# Patient Record
Sex: Male | Born: 1940 | Race: White | Hispanic: No | Marital: Married
Health system: Midwestern US, Community
[De-identification: ages and names within clinical notes are randomized; demographics above are authoritative.]

## PROBLEM LIST (undated history)

## (undated) DIAGNOSIS — Z85038 Personal history of other malignant neoplasm of large intestine: Secondary | ICD-10-CM

## (undated) DIAGNOSIS — K6289 Other specified diseases of anus and rectum: Principal | ICD-10-CM

## (undated) DIAGNOSIS — E119 Type 2 diabetes mellitus without complications: Secondary | ICD-10-CM

## (undated) DIAGNOSIS — I359 Nonrheumatic aortic valve disorder, unspecified: Secondary | ICD-10-CM

## (undated) DIAGNOSIS — F32A Depression, unspecified: Secondary | ICD-10-CM

## (undated) DIAGNOSIS — R569 Unspecified convulsions: Secondary | ICD-10-CM

## (undated) DIAGNOSIS — C801 Malignant (primary) neoplasm, unspecified: Secondary | ICD-10-CM

## (undated) DIAGNOSIS — F419 Anxiety disorder, unspecified: Secondary | ICD-10-CM

## (undated) DIAGNOSIS — K50911 Crohn's disease, unspecified, with rectal bleeding: Secondary | ICD-10-CM

## (undated) DIAGNOSIS — Z01818 Encounter for other preprocedural examination: Secondary | ICD-10-CM

## (undated) HISTORY — DX: Anxiety disorder, unspecified: F41.9

## (undated) HISTORY — PX: AORTIC VALVE REPLACEMENT: SHX41

## (undated) HISTORY — PX: ABLATION: SHX5711

## (undated) HISTORY — DX: Depression, unspecified: F32.A

## (undated) HISTORY — PX: PACEMAKER IMPLANT: EP1218

---

## 2016-06-23 ENCOUNTER — Inpatient Hospital Stay: Primary: Internal Medicine

## 2016-06-27 NOTE — Other (Signed)
Patient verified name, DOB, and procedure.    Type: 1a; abbreviated assessment per anesthesia guidelines  Labs per surgeon: unknown-no orders  Labs per anesthesia: none  Requested records from Sam at [facility].  Spoke with Marchelle [place]manda [place] to [place] defib.  Horton stated no need for defib rep DOS.  Call to [facility] to obtain permission to hold Eliquis.  Spoke with Jasmine DecemberSharon in [facility] to request     Instructed pt that they will be notified via office/GI LAB for time of arrival to GI lab.     Follow diet and prep instructions per office. NPO after midnight.    Bath or shower the night before and the am of surgery with antibacterial soap. No lotions, oils, powders, cologne on skin. No make up, eye make up or jewelry. Wear loose fitting comfortable, clean clothing.     Must have adult present in building the entire time .     Medications for the day of procedure ASA 81 mg (pt only taking in place of Eliquis), vimpat, metoprolol, patient to hold Multivitamin, calcium  - [facility] to hold Eliquis.  Pt reports per Dr Daisey MustMazenac last dose Eliquis 05-23-16.    The following discharge instructions reviewed with patient: medication given during procedure may cause drowsiness for several hours, therefore, do not drive or operate machinery for remainder of the day. You may not drink alcohol on the day of your procedure, please resume regular diet and activity unless otherwise directed. You may experience abdominal distention for several hours that is relieved by the passage of gas. Contact your physician if you have any of the following: fever or chills, severe abdominal pain or excessive amount of bleeding or a large amount when having a bowel movement. Occasional specks of blood with bowel movement would not be unusual.

## 2016-06-27 NOTE — Other (Signed)
Received stress test 04-01-16 "no ischemia noted", EKG 06-21-16 pacer rhythm, defib interrogation report 06-20-16,also on chart [facility] jud[facility] magnet documentation, Rostin NP note 05-29-16, permission to hold Eliquis 48 hours prior to procedure from [facility]ortonMerrell NP 06-26-16.

## 2016-06-28 ENCOUNTER — Inpatient Hospital Stay: Payer: MEDICARE

## 2016-06-28 ENCOUNTER — Ambulatory Visit

## 2016-06-28 HISTORY — PX: COLONOSCOPY WITH ESOPHAGOGASTRODUODENOSCOPY (EGD): SHX5779

## 2016-06-28 LAB — GLUCOSE, POC: Glucose (POC): 112 mg/dL — ABNORMAL HIGH (ref 65–100)

## 2016-06-28 MED ORDER — LACTATED RINGERS IV
INTRAVENOUS | Status: DC
Start: 2016-06-28 — End: 2016-06-28
  Administered 2016-06-28: 12:00:00 via INTRAVENOUS

## 2016-06-28 MED ORDER — PROPOFOL 10 MG/ML IV EMUL
10 mg/mL | INTRAVENOUS | Status: DC | PRN
Start: 2016-06-28 — End: 2016-06-28
  Administered 2016-06-28 (×2): via INTRAVENOUS

## 2016-06-28 MED ORDER — PROPOFOL 10 MG/ML IV EMUL
10 mg/mL | INTRAVENOUS | Status: DC | PRN
Start: 2016-06-28 — End: 2016-06-28
  Administered 2016-06-28 (×3): via INTRAVENOUS

## 2016-06-28 MED FILL — PROPOFOL 10 MG/ML IV EMUL: 10 mg/mL | INTRAVENOUS | Qty: 378.51

## 2016-06-28 NOTE — Anesthesia Post-Procedure Evaluation (Signed)
Post-Anesthesia Evaluation and Assessment    Patient: Lance DeforestLester Bradford MRN: [id number]  SSN: [id number]    Date of Birth: March 12, 1941  Age: 76 y.o.  Sex: male       Cardiovascular Function/Vital Signs  Visit Vitals   ??? BP 108/58   ??? Pulse 69   ??? Temp 36.7 ??C (98.1 ??F)   ??? Resp 14   ??? Ht 6\' 1"  (1.854 m)   ??? Wt 86.2 kg (190 lb)   ??? SpO2 95%   ??? BMI 25.07 kg/m2       Patient is status post total IV anesthesia anesthesia for Procedure(s):  COLONOSCOPY/ 25 / PT HAS ICD  ESOPHAGOGASTRODUODENOSCOPY (EGD)  ESOPHAGOGASTRODUODENAL (EGD) BIOPSY  COLON BIOPSY.    Nausea/Vomiting: None    Postoperative hydration reviewed and adequate.    Pain:  Pain Scale 1: Visual (06/28/16 0806)  Pain Intensity 1: 0 (06/28/16 0806)   Managed    Neurological Status:       At baseline    Mental Status and Level of Consciousness: Arousable    Pulmonary Status:   O2 Device: Nasal cannula (06/28/16 0806)   Adequate oxygenation and airway patent    Complications related to anesthesia: None    Post-anesthesia assessment completed. No concerns    Signed By: Hal NeerPaul E Zelig Gacek, MD     June 28, 2016

## 2016-06-28 NOTE — Progress Notes (Addendum)
Blood sugar was 112 in prep.

## 2016-06-28 NOTE — Procedures (Signed)
ESOPHAGOGASTRODUODENOSCOPY    ESOPHAGOGASTRODUODENOSCOPY    DATE of PROCEDURE: 06/28/2016    PT NAME: Lance Bradford     [id number]  HX Heartburn  MEDICATION:  TIVA    INSTRUMENT: GIF  H190      BLOOD LOSS- 0 or min.  SPEC- Duodenal and gastric biopsies    PROCEDURE:  Standard EGD.  No immediate complications.  Has a small hiatal hernia and mild gastritis.  Biopsies taken in the stomach and duodenum,  Duodenum looks normal.  Ampulla not seen. No retained liquids and no varices.  Esophagus looks normal.    ASSESSMENT:  1. Mild gastritis  2. Small hiatal hernia    PLAN:   1. Continue Prilosec  2. F/U biopsies    Aldred Mase A Corrin Sieling, MD

## 2016-06-28 NOTE — Progress Notes (Signed)
Blood sugar 112

## 2016-06-28 NOTE — Other (Signed)
Patient discharged to car by w/c with staff . Seen by MD. Vitals stable. Able to pass gas. Discharge instructions reviewed with patient and family. Copy sent home with family and patient.

## 2016-06-28 NOTE — H&P (Signed)
Date of Surgery Update:  Lance Bradford was seen and examined.  History and physical has been reviewed. The patient has been examined. There have been no significant clinical changes since the completion of the originally dated History and Physical.    Signed By: Dionne MiloPaul A Divine Imber, MD     June 28, 2016 7:02 AM

## 2016-06-28 NOTE — Anesthesia Pre-Procedure Evaluation (Addendum)
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   Other Findings            Physical Exam    Airway  Mallampati: II  TM Distance: > 6 cm  Neck ROM: decreased range of motion   Mouth opening: Normal     Cardiovascular    Rhythm: irregular  Rate: normal    Murmur: Grade 1, Aortic area     Dental    Dentition: Full upper dentures     Pulmonary  Breath sounds clear to auscultation               Abdominal  GI exam deferred       Other Findings            Anesthetic Plan    ASA: 3  Anesthesia type: total IV anesthesia            Anesthetic plan and risks discussed with: Patient and Spouse

## 2016-06-28 NOTE — Procedures (Signed)
ESOPHAGOGASTRODUODENOSCOPY    ESOPHAGOGASTRODUODENOSCOPY    DATE of PROCEDURE: 06/28/2016    PT NAME: Lance Bradford     [id number]  HX Heartburn  MEDICATION:  TIVA    INSTRUMENT: GIF  H190      BLOOD LOSS- 0 or min.  SPEC- Duodenal and gastric biopsies    PROCEDURE:  Standard EGD.  No immediate complications.  Has a small hiatal hernia and mild gastritis.  Biopsies taken in the stomach and duodenum,  Duodenum looks normal.  Ampulla not seen. No retained liquids and no varices.  Esophagus looks normal.    ASSESSMENT:  1. Mild gastritis  2. Small hiatal hernia    PLAN:   1. Continue Prilosec  2. F/U biopsies    Dionne MiloPaul A Quintara Bost, MD

## 2016-06-28 NOTE — Procedures (Signed)
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  They may resume normal diet and medications unless otherwise instructed in recovery.  Start Eliquis tomorrow    Surveillance interval: 3 years    Anesthesia/Sedation: TIVA  Procedure Details:  Informed consent was obtained for the procedure, including sedation.  Risks of perforation, hemorrhage, adverse drug reaction and aspiration were discussed. The patient was placed in the left lateral decubitus position.  Based on the pre-procedure assessment, including review of the patient's medical history, medications, allergies, and review of systems, he had been deemed to be an appropriate candidate for conscious sedation; he was therefore sedated with the medications listed below.   The patient was monitored continuously with ECG tracing, pulse oximetry, blood pressure monitoring, and direct observations.      A rectal examination was performed.  The colonoscope was inserted into the rectum and advanced under direct vision to the ileo cectomy, anastomosis The quality of the colonic preparation was good.   A careful inspection was made as the colonoscope was withdrawn.    Findings:    ILEUM: The ileum was entered and appeared with a moderate number of ulcers of moderate size.  Mild stenosis was seen.  Biopsies were taken at the anastomosis.  COLON:  The colonic mucosa from the anastomosis appeared normal with normal vascularity.  There was no  inflammatory changes, mucosal polyps, raised lesions, vascular ectasias or abnormal pigmentation.  Random biopsies were taken.  ANUS/RECTUM: Anal exam reveals no masses or hemorrhoids, sphincter tone is normal.  A possible healing fistula was seen. Rectal exam reveals no masses or  hemorrhoids.  On retroflex there was a possible fistula.      EBL: Minimal           PATH:  Anastomosis biopsies and random colon    Complications: None; patient tolerated the procedure well.           Attending Attestation: I performed the procedure.    Plan to follow up biopsies and plan therapy with OV.    Dionne MiloPaul A Emmalia Heyboer, MD

## 2016-06-28 NOTE — Procedures (Signed)
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  They may resume normal diet and medications unless otherwise instructed in recovery.  Start Eliquis tomorrow    Surveillance interval: 3 years    Anesthesia/Sedation: TIVA  Procedure Details:  Informed consent was obtained for the procedure, including sedation.  Risks of perforation, hemorrhage, adverse drug reaction and aspiration were discussed. The patient was placed in the left lateral decubitus position.  Based on the pre-procedure assessment, including review of the patient's medical history, medications, allergies, and review of systems, he had been deemed to be an appropriate candidate for conscious sedation; he was therefore sedated with the medications listed below.   The patient was monitored continuously with ECG tracing, pulse oximetry, blood pressure monitoring, and direct observations.      A rectal examination was performed.  The colonoscope was inserted into the rectum and advanced under direct vision to the ileo cectomy, anastomosis The quality of the colonic preparation was good.   A careful inspection was made as the colonoscope was withdrawn.    Findings:    ILEUM: The ileum was entered and appeared with a moderate number of ulcers of moderate size.  Mild stenosis was seen.  Biopsies were taken at the anastomosis.  COLON:  The colonic mucosa from the anastomosis appeared normal with normal vascularity.  There was no  inflammatory changes, mucosal polyps, raised lesions, vascular ectasias or abnormal pigmentation.  Random biopsies were taken.  ANUS/RECTUM: Anal exam reveals no masses or hemorrhoids, sphincter tone is normal.  A possible healing fistula was seen. Rectal exam reveals no  masses or hemorrhoids.  On retroflex there was a possible fistula.      EBL: Minimal           PATH:  Anastomosis biopsies and random colon    Complications: None; patient tolerated the procedure well.           Attending Attestation: I performed the procedure.    Plan to follow up biopsies and plan therapy with OV.    Dionne MiloPaul A Dahl Higinbotham, MD

## 2017-12-03 ENCOUNTER — Inpatient Hospital Stay: Admit: 2017-12-03 | Payer: MEDICARE | Primary: Internal Medicine

## 2017-12-03 ENCOUNTER — Inpatient Hospital Stay
Admit: 2017-12-03 | Discharge: 2017-12-05 | Disposition: A | Discharge status: Home / Self Care | Payer: MEDICARE | Attending: Family Medicine | Admitting: Family Medicine

## 2017-12-03 DIAGNOSIS — K50011 Crohn's disease of small intestine with rectal bleeding: Secondary | ICD-10-CM

## 2017-12-03 LAB — METABOLIC PANEL, COMPREHENSIVE
A-G Ratio: 1.1 — ABNORMAL LOW (ref 1.2–3.5)
ALT (SGPT): 29 U/L (ref 12–65)
AST (SGOT): 20 U/L (ref 15–37)
Albumin: 3.4 g/dL (ref 3.2–4.6)
Alk. phosphatase: 66 U/L (ref 50–136)
Anion gap: 7 mmol/L (ref 7–16)
BUN: 17 MG/DL (ref 8–23)
Bilirubin, total: 0.4 MG/DL (ref 0.2–1.1)
CO2: 30 mmol/L (ref 21–32)
Calcium: 8.7 MG/DL (ref 8.3–10.4)
Chloride: 104 mmol/L (ref 98–107)
Creatinine: 1.46 MG/DL (ref 0.8–1.5)
GFR est AA: >60 mL/min/{1.73_m2} (ref >60)
GFR est non-AA: 50 mL/min/{1.73_m2} — ABNORMAL LOW (ref >60)
Globulin: 3.2 g/dL (ref 2.3–3.5)
Glucose: 111 mg/dL — ABNORMAL HIGH (ref 65–100)
Potassium: 3.9 mmol/L (ref 3.5–5.1)
Protein, total: 6.6 g/dL (ref 6.3–8.2)
Sodium: 141 mmol/L (ref 136–145)

## 2017-12-03 LAB — CBC WITH AUTOMATED DIFF
ABS. BASOPHILS: 0.1 10*3/uL (ref 0.0–0.2)
ABS. EOSINOPHILS: 0.2 10*3/uL (ref 0.0–0.8)
ABS. IMM. GRANS.: 0 10*3/uL (ref 0.0–0.5)
ABS. LYMPHOCYTES: 1.2 10*3/uL (ref 0.5–4.6)
ABS. MONOCYTES: 0.4 10*3/uL (ref 0.1–1.3)
ABS. NEUTROPHILS: 4.3 10*3/uL (ref 1.7–8.2)
ABSOLUTE NRBC: 0 10*3/uL (ref 0.0–0.2)
BASOPHILS: 1 % (ref 0.0–2.0)
EOSINOPHILS: 3 % (ref 0.5–7.8)
HCT: 31.3 % — ABNORMAL LOW (ref 41.1–50.3)
HGB: 9.4 g/dL — ABNORMAL LOW (ref 13.6–17.2)
IMMATURE GRANULOCYTES: 0 % (ref 0.0–5.0)
LYMPHOCYTES: 20 % (ref 13–44)
MCH: 23.5 PG — ABNORMAL LOW (ref 26.1–32.9)
MCHC: 30 g/dL — ABNORMAL LOW (ref 31.4–35.0)
MCV: 78.3 FL — ABNORMAL LOW (ref 79.6–97.8)
MONOCYTES: 6 % (ref 4.0–12.0)
MPV: 10.3 FL (ref 9.4–12.3)
NEUTROPHILS: 71 % (ref 43–78)
PLATELET: 187 10*3/uL (ref 150–450)
RBC: 4 M/uL — ABNORMAL LOW (ref 4.23–5.6)
RDW: 16.5 % — ABNORMAL HIGH (ref 11.9–14.6)
WBC: 6.1 10*3/uL (ref 4.3–11.1)

## 2017-12-03 LAB — EKG, 12 LEAD, INITIAL
Atrial Rate: 170 {beats}/min
Calculated R Axis: -79 degrees
Calculated T Axis: 91 degrees
Q-T Interval: 504 ms
QRS Duration: 220 ms
QTC Calculation (Bezet): 540 ms
Ventricular Rate: 69 {beats}/min

## 2017-12-03 LAB — FERRITIN
Ferritin: 12 NG/ML (ref 8–388)
Ferritin: 12 NG/ML (ref 8–388)

## 2017-12-03 LAB — TRANSFERRIN SATURATION
Iron: 15 ug/dL — ABNORMAL LOW (ref 35–150)
Iron: 15 ug/dL — ABNORMAL LOW (ref 35–150)
TIBC: 468 ug/dL — ABNORMAL HIGH (ref 250–450)
TIBC: 468 ug/dL — ABNORMAL HIGH (ref 250–450)
TRANSFERRIN SATURATION: 3 % — ABNORMAL LOW (ref >20)
Transferrin Saturation: 3 % — ABNORMAL LOW (ref >20)

## 2017-12-03 LAB — IRON
Iron: 18 ug/dL — ABNORMAL LOW (ref 35–150)
Iron: 18 ug/dL — ABNORMAL LOW (ref 35–150)

## 2017-12-03 LAB — HGB & HCT
HCT: 31.5 % — ABNORMAL LOW (ref 41.1–50.3)
HGB: 9.5 g/dL — ABNORMAL LOW (ref 13.6–17.2)

## 2017-12-03 LAB — EKG 12-LEAD
Atrial Rate: 170 {beats}/min
Q-T Interval: 504 ms
QRS Duration: 220 ms
QTc Calculation (Bazett): 540 ms
R Axis: -79 degrees
T Axis: 91 degrees
Ventricular Rate: 69 {beats}/min

## 2017-12-03 LAB — COMPREHENSIVE METABOLIC PANEL
ALT: 29 U/L (ref 12–65)
AST: 20 U/L (ref 15–37)
Albumin/Globulin Ratio: 1.1 — ABNORMAL LOW (ref 1.2–3.5)
Albumin: 3.4 g/dL (ref 3.2–4.6)
Alkaline Phosphatase: 66 U/L (ref 50–136)
Anion Gap: 7 mmol/L (ref 7–16)
BUN: 17 MG/DL (ref 8–23)
CO2: 30 mmol/L (ref 21–32)
Calcium: 8.7 MG/DL (ref 8.3–10.4)
Chloride: 104 mmol/L (ref 98–107)
Creatinine: 1.46 MG/DL (ref 0.8–1.5)
EGFR IF NonAfrican American: 50 mL/min/{1.73_m2} — ABNORMAL LOW (ref >60)
GFR African American: >60 mL/min/{1.73_m2} (ref >60)
Globulin: 3.2 g/dL (ref 2.3–3.5)
Glucose: 111 mg/dL — ABNORMAL HIGH (ref 65–100)
Potassium: 3.9 mmol/L (ref 3.5–5.1)
Sodium: 141 mmol/L (ref 136–145)
Total Bilirubin: 0.4 MG/DL (ref 0.2–1.1)
Total Protein: 6.6 g/dL (ref 6.3–8.2)

## 2017-12-03 LAB — HEMOGLOBIN AND HEMATOCRIT
Hematocrit: 31.5 % — ABNORMAL LOW (ref 41.1–50.3)
Hemoglobin: 9.5 g/dL — ABNORMAL LOW (ref 13.6–17.2)

## 2017-12-03 LAB — CBC WITH AUTO DIFFERENTIAL
Basophils %: 1 % (ref 0.0–2.0)
Basophils Absolute: 0.1 10*3/uL (ref 0.0–0.2)
Eosinophils %: 3 % (ref 0.5–7.8)
Eosinophils Absolute: 0.2 10*3/uL (ref 0.0–0.8)
Granulocyte Absolute Count: 0 10*3/uL (ref 0.0–0.5)
Hematocrit: 31.3 % — ABNORMAL LOW (ref 41.1–50.3)
Hemoglobin: 9.4 g/dL — ABNORMAL LOW (ref 13.6–17.2)
Immature Granulocytes: 0 % (ref 0.0–5.0)
Lymphocytes %: 20 % (ref 13–44)
Lymphocytes Absolute: 1.2 10*3/uL (ref 0.5–4.6)
MCH: 23.5 PG — ABNORMAL LOW (ref 26.1–32.9)
MCHC: 30 g/dL — ABNORMAL LOW (ref 31.4–35.0)
MCV: 78.3 FL — ABNORMAL LOW (ref 79.6–97.8)
MPV: 10.3 FL (ref 9.4–12.3)
Monocytes %: 6 % (ref 4.0–12.0)
Monocytes Absolute: 0.4 10*3/uL (ref 0.1–1.3)
NRBC Absolute: 0 10*3/uL (ref 0.0–0.2)
Neutrophils %: 71 % (ref 43–78)
Neutrophils Absolute: 4.3 10*3/uL (ref 1.7–8.2)
Platelets: 187 10*3/uL (ref 150–450)
RBC: 4 M/uL — ABNORMAL LOW (ref 4.23–5.6)
RDW: 16.5 % — ABNORMAL HIGH (ref 11.9–14.6)
WBC: 6.1 10*3/uL (ref 4.3–11.1)

## 2017-12-03 MED ORDER — AMIODARONE 200 MG TAB
200 mg | Freq: Every day | ORAL | Status: DC
Start: 2017-12-03 — End: 2017-12-05
  Administered 2017-12-04 – 2017-12-05 (×2): via ORAL

## 2017-12-03 MED ORDER — PANTOPRAZOLE 40 MG IV SOLR
40 mg | Freq: Two times a day (BID) | INTRAVENOUS | Status: DC
Start: 2017-12-03 — End: 2017-12-04
  Administered 2017-12-04 (×2): via INTRAVENOUS

## 2017-12-03 MED ORDER — POTASSIUM CHLORIDE 20 MEQ ORAL PACKET FOR SOLUTION
20 mEq | Freq: Every day | ORAL | Status: DC
Start: 2017-12-03 — End: 2017-12-05
  Administered 2017-12-04 – 2017-12-05 (×2): via ORAL

## 2017-12-03 MED ORDER — ASCORBIC ACID 500 MG TAB
500 mg | Freq: Every day | ORAL | Status: DC
Start: 2017-12-03 — End: 2017-12-05
  Administered 2017-12-04 – 2017-12-05 (×2): via ORAL

## 2017-12-03 MED ORDER — LACOSAMIDE 50 MG TAB
50 mg | Freq: Two times a day (BID) | ORAL | Status: DC
Start: 2017-12-03 — End: 2017-12-05
  Administered 2017-12-04 – 2017-12-05 (×4): via ORAL

## 2017-12-03 MED ORDER — LACOSAMIDE 100 MG TAB
100 mg | Freq: Two times a day (BID) | ORAL | Status: DC
Start: 2017-12-03 — End: 2017-12-03

## 2017-12-03 MED ORDER — ISOSORBIDE MONONITRATE SR 60 MG 24 HR TAB
60 mg | Freq: Two times a day (BID) | ORAL | Status: DC
Start: 2017-12-03 — End: 2017-12-03

## 2017-12-03 MED ORDER — PRAVASTATIN 80 MG TAB
80 mg | Freq: Every evening | ORAL | Status: DC
Start: 2017-12-03 — End: 2017-12-05
  Administered 2017-12-04 – 2017-12-05 (×2): via ORAL

## 2017-12-03 MED ORDER — ISOSORBIDE MONONITRATE SR 60 MG 24 HR TAB
60 mg | Freq: Every day | ORAL | Status: DC
Start: 2017-12-03 — End: 2017-12-05
  Administered 2017-12-04 – 2017-12-05 (×2): via ORAL

## 2017-12-03 MED ORDER — MULTIVITAMIN ORAL LIQUID
Freq: Every day | ORAL | Status: DC
Start: 2017-12-03 — End: 2017-12-05
  Administered 2017-12-04 – 2017-12-05 (×2): via ORAL

## 2017-12-03 MED ORDER — PANTOPRAZOLE 40 MG IV SOLR
40 mg | Freq: Two times a day (BID) | INTRAVENOUS | Status: DC
Start: 2017-12-03 — End: 2017-12-03

## 2017-12-03 MED ORDER — SODIUM CHLORIDE 0.9 % IV
INTRAVENOUS | Status: DC | PRN
Start: 2017-12-03 — End: 2017-12-05

## 2017-12-03 MED FILL — VIMPAT 100 MG TABLET: 100 mg | ORAL | Qty: 1

## 2017-12-03 MED FILL — SODIUM CHLORIDE 0.9 % IV: INTRAVENOUS | Qty: 250

## 2017-12-03 MED FILL — VIMPAT 50 MG TABLET: 50 mg | ORAL | Qty: 1

## 2017-12-03 MED FILL — MULTIVITAMIN ORAL LIQUID: ORAL | Qty: 5

## 2017-12-03 NOTE — ED Notes (Signed)
TRANSFER - OUT REPORT:    Verbal report given to Kim, RN(name) on Lance Bradford  being transferred to 615(unit) for routine progression of care       Report consisted of patient???s Situation, Background, Assessment and   Recommendations(SBAR).     Information from the following report(s) SBAR, Kardex, ED Summary, MAR and Recent Results was reviewed with the receiving nurse.    Lines:   Peripheral IV 12/03/17 Left Antecubital (Active)        Opportunity for questions and clarification was provided.      Patient transported with:   Tech

## 2017-12-03 NOTE — H&P (View-Only) (Signed)
[facility] Consult Note       Primary GI Physician: Dr. P. Mazanec    Referring Provider: Dr. K. Mewborn, ED    Consult Date:  12/03/2017    Admit Date:  12/03/2017    Chief Complaint:  Melena    Subjective:     History of Present Illness:  Patient is a 76 y.o. male with PMH of (but not limited to) Crohn's ileitis with resection and currently not on medical treatment, A fib with multiple ablations on Eliquis and followed at [facility] with return appt with them next month, DM, HTN, Atrial valve repair followed by Dr. T. Chandler, PVC, Melanoma, Cardiac defribillator, Seizures, OSA, who is seen in consultation at the request of Dr.K. Mewborn for melena and anemia. Lance Bradford has noticed increasing fatigue and exercise intolerance. He has had increasing dyspnea and edema. He developed "black tarry stools" 4 days ago with up to 3 stools in a day prompting a visit to the ED. He had a hemocult card at home and tested his stool which was positive. He has had some left lower abdominal tenderness, but denies any epigastric abdominal pain, N&V, GERD, Dysphagia or new weight loss (he lost weight after atrial valve repair, but had maintained it). He takes Eliquis daily and Aleve 2 pills every 2 weeks. He denies any tobacco or ETOH. Hgb on admission 9.4, Hct 31.3, MCV 78.3, RDW 16.5, plt 187. Previous Hgb in our chart reveal Hgb 12-13 range. BUN 17, creat 1.46. He is being admitted by the hospital service.     Lance Bradford has known Crohn's disease with previous resection of his small bowel. He was last evaluated in our office in 12/2016 by Dr. Mazanec and recommended Entyvio. 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 He has been on Remicade in the past approximately 20 years ago and has been on no medical therapy in years. He has 4-5 BM daily, no nocturnal and no hematochezia.      EGD on 06/28/16 by Dr. Mazanec revealed mild gastritis and small hiatal hernia  Colonoscopy on 06/28/17 by Dr. P. Mazanec revealed moderate number of ulcers at the ileum and mild stenosis. 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 Pathology revealed chronic and acute changes. Repeat in 3 years.     PMH:  Past Medical History:   Diagnosis Date   ??? Arthritis     hips and back    ??? Atrial fibrillation (HCC)     several ablations    ??? Cancer (HCC)     melenoma    ??? 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 Colon polyps     cancerous - removed    ??? Crohn disease (HCC)    ??? 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 last A1C ~ 7.1   ??? Hypertension     simce AVR BP is stable    ??? Liver disease     jaundice 1952   ??? Nausea & vomiting     two occasions    ??? PVC (premature ventricular contraction)     one ablation done    ??? S/P AVR    ??? Seizures (HCC)     most recent 04-24-16   ??? Sleep apnea     does not have   to use C PAP after 20 pound weight loss        PSH:  Past Surgical History:   Procedure Laterality Date   ??? COLONOSCOPY N/A 06/28/2016    COLONOSCOPY/ 25 / PT HAS ICD performed by Paul A Mazanec, MD at [facility]   ??? HX AORTIC VALVE REPLACEMENT     ??? HX APPENDECTOMY     ??? HX COLECTOMY      times 4 or 5 - different times    ??? HX COLONOSCOPY     ??? HX HEART CATHETERIZATION      times 2   ??? HX HEMORRHOIDECTOMY     ??? HX HERNIA REPAIR Bilateral    ??? HX SHOULDER ARTHROSCOPY Right    ??? HX VASECTOMY         Allergies:  Allergies   Allergen Reactions   ??? Latex Other (comments)     Blisters     ??? Codeine Other (comments)     Chest pain    ??? Morphine Other (comments)     Morphine doesn't work    ??? Prozac [Fluoxetine] Other (comments)     "turn red in the sun"    ??? Tape [Adhesive] Other (comments)     Blisters        Home Medications:  Prior to Admission medications    Medication Sig Start Date End Date Taking? Authorizing Provider    metoprolol tartrate (LOPRESSOR) 25 mg tablet Take 25 mg by mouth two (2) times a day. Take day of surgery per anesthesia protocol.    Provider, Historical   potassium chloride SR (K-TAB) 10 mEq tablet Take 20 mEq by mouth two (2) times a day.    Provider, Historical   apixaban (ELIQUIS) 5 mg tablet Take 5 mg by mouth two (2) times a day. Takes due to AVR   Last dose 05-23-16 -per pt , per Dr Mazenac    Provider, Historical   multivitamin (ONE A DAY) tablet Take 1 Tab by mouth daily. Stop seven days prior to surgery per anesthesia protocol.    Provider, Historical   lacosamide (VIMPAT) 100 mg tab tablet Take 100 mg by mouth two (2) times a day. Take day of surgery per anesthesia protocol.    Provider, Historical   ferrous sulfate (IRON) 325 mg (65 mg iron) tablet Take 65 mg by mouth Daily (before breakfast).    Provider, Historical   aspirin delayed-release 81 mg tablet Take 81 mg by mouth daily. Take day of surgery per anesthesia protocol.    Provider, Historical   metroNIDAZOLE (FLAGYL) 500 mg tablet Take 500 mg by mouth three (3) times daily.    Provider, Historical   ascorbic acid, vitamin C, (VITAMIN C) 500 mg tablet Take 500 mg by mouth daily. Stop seven days prior to surgery per anesthesia protocol.    Provider, Historical       Hospital Medications:  Current Facility-Administered Medications   Medication Dose Route Frequency   ??? 0.9% sodium chloride infusion 250 mL  250 mL IntraVENous PRN     Current Outpatient Medications   Medication Sig   ??? metoprolol tartrate (LOPRESSOR) 25 mg tablet Take 25 mg by mouth two (2) times a day. Take day of surgery per anesthesia protocol.   ??? potassium chloride SR (K-TAB) 10 mEq tablet Take 20 mEq by mouth two (2) times a day.   ??? apixaban (ELIQUIS) 5 mg tablet Take 5 mg by mouth two (2) times a day. Takes   due to AVR   Last dose 05-23-16 -per pt , per Dr Mazenac   ??? multivitamin (ONE A DAY) tablet Take 1 Tab by mouth daily. Stop seven  days prior to surgery per anesthesia protocol.   ??? lacosamide (VIMPAT) 100 mg tab tablet Take 100 mg by mouth two (2) times a day. Take day of surgery per anesthesia protocol.   ??? ferrous sulfate (IRON) 325 mg (65 mg iron) tablet Take 65 mg by mouth Daily (before breakfast).   ??? aspirin delayed-release 81 mg tablet Take 81 mg by mouth daily. Take day of surgery per anesthesia protocol.   ??? metroNIDAZOLE (FLAGYL) 500 mg tablet Take 500 mg by mouth three (3) times daily.   ??? ascorbic acid, vitamin C, (VITAMIN C) 500 mg tablet Take 500 mg by mouth daily. Stop seven days prior to surgery per anesthesia protocol.       Social History:  Social History     Tobacco Use   ??? Smoking status: Former Smoker     Packs/day: 1.00     Years: 13.00     Pack years: 13.00     Last attempt to quit: 07/10/1976     Years since quitting: 41.4   ??? Smokeless tobacco: Never Used   Substance Use Topics   ??? Alcohol use: Yes     Comment: seldom - once a year          Family History:  Family History   Problem Relation Age of Onset   ??? Cancer Mother    ??? Hypertension Mother    ??? Cancer Father    ??? Diabetes Father    ??? Hypertension Father        Review of Systems:  A detailed 10 system ROS is obtained, with pertinent positives as listed above.  All others are negative.    Diet:  NPO    Objective:     Physical Exam:  Vitals:  Visit Vitals  BP 122/56 (BP 1 Location: Right arm, BP Patient Position: At rest)   Pulse 72   Temp 98.2 ??F (36.8 ??C)   Resp 18   Ht 6' 1" (1.854 m)   Wt 79.4 kg (175 lb)   SpO2 100%   BMI 23.09 kg/m??     Gen:  Pt is alert, cooperative, no acute distress   Skin:  Extremities and face reveal no rashes.   HEENT: Sclerae anicteric.  Extra-occular muscles are intact.  No oral ulcers.  No abnormal pigmentation of the lips.  The neck is supple.  Cardiovascular: Regular rate and rhythm. No  gallops, or rubs. MURMUR III/VI  Respiratory:  Comfortable breathing with no accessory muscle use. Clear  breath sounds anteriorly with no wheezes, rales, or rhonchi.  GI:  Abdomen nondistended, soft, and  MILD TENDERNESS IN THE LLQ  Normal active bowel sounds. No enlargement of the liver or spleen. No masses palpable.  Rectal:  Deferred  Musculoskeletal:  1+ edema of the lower legs.    Neurological:  Gross memory appears intact.  Patient is alert and oriented.  Psychiatric:  Mood appears appropriate with judgement intact.  Lymphatic:  No cervical or supraclavicular adenopathy.    Laboratory:    Recent Labs     12/03/17  1408   WBC 6.1   HGB 9.4*   HCT 31.3*   PLT 187   MCV 78.3*   NA 141   K 3.9   CL 104   CO2 30   BUN 17     CREA 1.46   CA 8.7   GLU 111*   AP 66   SGOT 20   ALT 29   TBILI 0.4   ALB 3.4   TP 6.6          Assessment:     Active Problems:    GI bleed (12/03/2017)    76 y.o. male with PMH of (but not limited to) Crohn's ileitis with resection and currently not on medical treatment, A fib with multiple ablations on Eliquis and followed at [facility] with return appt with them next month, DM, HTN, Atrial valve repair followed by Dr. T. Chandler, PVC, Melanoma, Cardiac defribillator, Seizures, OSA, who is seen in consultation at the request of Dr.K. Mewborn for melena and anemia. He has a heme positive stool with "black tarry stools" over the last 4 days. Hgb on admission 9.4  with previous Hgb in our chart reveal Hgb 12-13 range.He has been on Eliquis and Aleve and likely has an UGIB. PUD, NSAID induced gastritis, Dieulafoy lesion and AVM are potential differentials.     Lance Bradford has known Crohn's disease with previous resection of his small bowel. He was last evaluated in our office in 12/2016 by Dr. Mazanec and recommended Entyvio.He has failed Remicade previously and has been off all medical therapy for years. He had active disease on recent colonoscopy 06/2016.     Plan:     1.Agree with admission by hospital service  2.Type and Cross  3.Serial H&H and transfuse as needed  4.Iron/TIBC/ferritin   5.Pantoprazole 40mg IV bid  6.Clear liquid diet  7.NPO after midnight  8.EGD tomorrow 12/03/17 by Dr. I. Mahmood Boehringer. Risks and benefits discussed to include risk of infection, bleeding, perforation, anesthesia, and possible mortality. He verbalized understanding and wishes to proceed with EGD.  9.Hold Eliquis for procedure tomorrow    Tracy M. Luther, FNP   [facility]  Patient is seen and examined in collaboration with Dr. I. Benyamin Jeff. Assessment and plan as per Dr. I. Maybell Misenheimer.     I have seen and examined the patient, and I have directed and agreed to the plan as described.  He has evidence of slow GI bleeding, perhaps with more recently.  This is complicated by therapeutic anticoagulation.  I strongly suspect his untreated Crohn's disease.    D. Isaac Cruzito Standre, MD

## 2017-12-03 NOTE — ED Triage Notes (Signed)
Pt states he has had a lot of rectal bleeding for the last 5 days.  Describes stools as black.  Has hx of hemorrhoids.  Has had aortic valve replacement and is on eliquis.  No pepto bismol.  Denies nausea/vomiting but is having dizziness.

## 2017-12-03 NOTE — Consults (Addendum)
[facility] Consult Note       Primary GI Physician: Dr. Piedad Climes    Referring Provider: Dr. Kirtland Bouchard. Mewborn, ED    Consult Date:  12/03/2017    Admit Date:  12/03/2017    Chief Complaint:  Melena    Subjective:     History of Present Illness:  Patient is a 77 y.o. male with PMH of (but not limited to) Crohn's ileitis with resection and currently not on medical treatment, A fib with multiple ablations on Eliquis and followed at [facility] with return appt with them next month, DM, HTN, Atrial valve repair followed by Dr. Jiles Prows, PVC, Melanoma, Cardiac defribillator, Seizures, OSA, who is seen in consultation at the request of Dr.K. Mewborn for melena and anemia. Mr. Obey has noticed increasing fatigue and exercise intolerance. He has had increasing dyspnea and edema. He developed "black tarry stools" 4 days ago with up to 3 stools in a day prompting a visit to the ED. He had a hemocult card at home and tested his stool which was positive. He has had some left lower abdominal tenderness, but denies any epigastric abdominal pain, N&V, GERD, Dysphagia or new weight loss (he lost weight after atrial valve repair, but had maintained it). He takes Eliquis daily and Aleve 2 pills every 2 weeks. He denies any tobacco or ETOH. Hgb on admission 9.4, Hct 31.3, MCV 78.3, RDW 16.5, plt 187. Previous Hgb in our chart reveal Hgb 12-13 range. BUN 17, creat 1.46. He is being admitted by the hospital service.     Mr. Pangborn has known Crohn's disease with previous resection of his small bowel. He was last evaluated in our office in 12/2016 by Dr. Marisa Sprinkles and recommended Entyvio. 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 He has been on Remicade in the past approximately 20 years ago and has been on no medical therapy in years. He has 4-5 BM daily, no nocturnal and no hematochezia.      EGD on 06/28/16 by Dr. Marisa Sprinkles revealed mild gastritis and small hiatal hernia  Colonoscopy on 06/28/17 by Dr. Piedad Climes revealed moderate number of ulcers at the ileum and mild stenosis. 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 Pathology revealed chronic and acute changes. Repeat in 3 years.     PMH:  Past Medical History:   Diagnosis Date   ??? Arthritis     hips and back    ??? Atrial fibrillation (HCC)     several ablations    ??? Cancer (HCC)     melenoma    ??? 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 Colon polyps     cancerous - removed    ??? Crohn disease (HCC)    ??? 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 last A1C ~ 7.1   ??? Hypertension     simce AVR BP is stable    ??? Liver disease     jaundice 1952   ??? Nausea & vomiting     two occasions    ??? PVC (premature ventricular contraction)     one ablation done    ??? S/P AVR    ??? Seizures (HCC)     most recent 04-24-16   ??? Sleep apnea     does not have  to use C PAP after 20 pound weight loss        PSH:  Past Surgical History:   Procedure Laterality Date   ??? COLONOSCOPY N/A 06/28/2016    COLONOSCOPY/ 25 / PT HAS ICD performed by Dionne Milo, MD at [facility] ENDOSCOPY   ??? HX AORTIC VALVE REPLACEMENT     ??? HX APPENDECTOMY     ??? HX COLECTOMY      times 4 or 5 - different times    ??? HX COLONOSCOPY     ??? HX HEART CATHETERIZATION      times 2   ??? HX HEMORRHOIDECTOMY     ??? HX HERNIA REPAIR Bilateral    ??? HX SHOULDER ARTHROSCOPY Right    ??? HX VASECTOMY         Allergies:  Allergies   Allergen Reactions   ??? Latex Other (comments)     Blisters     ??? Codeine Other (comments)     Chest pain    ??? Morphine Other (comments)     Morphine doesn't work    ??? Prozac [Fluoxetine] Other (comments)     "turn red in the sun"    ??? Tape [Adhesive] Other (comments)     Blisters        Home Medications:  Prior to Admission medications    Medication Sig Start Date End Date Taking? Authorizing Provider    metoprolol tartrate (LOPRESSOR) 25 mg tablet Take 25 mg by mouth two (2) times a day. Take day of surgery per anesthesia protocol.    Provider, Historical   potassium chloride SR (K-TAB) 10 mEq tablet Take 20 mEq by mouth two (2) times a day.    Provider, Historical   apixaban (ELIQUIS) 5 mg tablet Take 5 mg by mouth two (2) times a day. Takes due to AVR   Last dose 05-23-16 -per pt , per Dr Daisey Must    Provider, Historical   multivitamin (ONE A DAY) tablet Take 1 Tab by mouth daily. Stop seven days prior to surgery per anesthesia protocol.    Provider, Historical   lacosamide (VIMPAT) 100 mg tab tablet Take 100 mg by mouth two (2) times a day. Take day of surgery per anesthesia protocol.    Provider, Historical   ferrous sulfate (IRON) 325 mg (65 mg iron) tablet Take 65 mg by mouth Daily (before breakfast).    Provider, Historical   aspirin delayed-release 81 mg tablet Take 81 mg by mouth daily. Take day of surgery per anesthesia protocol.    Provider, Historical   metroNIDAZOLE (FLAGYL) 500 mg tablet Take 500 mg by mouth three (3) times daily.    Provider, Historical   ascorbic acid, vitamin C, (VITAMIN C) 500 mg tablet Take 500 mg by mouth daily. Stop seven days prior to surgery per anesthesia protocol.    Provider, Historical       Hospital Medications:  Current Facility-Administered Medications   Medication Dose Route Frequency   ??? 0.9% sodium chloride infusion 250 mL  250 mL IntraVENous PRN     Current Outpatient Medications   Medication Sig   ??? metoprolol tartrate (LOPRESSOR) 25 mg tablet Take 25 mg by mouth two (2) times a day. Take day of surgery per anesthesia protocol.   ??? potassium chloride SR (K-TAB) 10 mEq tablet Take 20 mEq by mouth two (2) times a day.   ??? apixaban (ELIQUIS) 5 mg tablet Take 5 mg by mouth two (2) times a day. Takes  due to AVR   Last dose 05-23-16 -per pt , per Dr Daisey Must   ??? multivitamin (ONE A DAY) tablet Take 1 Tab by mouth daily. Stop seven  days prior to surgery per anesthesia protocol.   ??? lacosamide (VIMPAT) 100 mg tab tablet Take 100 mg by mouth two (2) times a day. Take day of surgery per anesthesia protocol.   ??? ferrous sulfate (IRON) 325 mg (65 mg iron) tablet Take 65 mg by mouth Daily (before breakfast).   ??? aspirin delayed-release 81 mg tablet Take 81 mg by mouth daily. Take day of surgery per anesthesia protocol.   ??? metroNIDAZOLE (FLAGYL) 500 mg tablet Take 500 mg by mouth three (3) times daily.   ??? ascorbic acid, vitamin C, (VITAMIN C) 500 mg tablet Take 500 mg by mouth daily. Stop seven days prior to surgery per anesthesia protocol.       Social History:  Social History     Tobacco Use   ??? Smoking status: Former Smoker     Packs/day: 1.00     Years: 13.00     Pack years: 13.00     Last attempt to quit: 07/10/1976     Years since quitting: 41.4   ??? Smokeless tobacco: Never Used   Substance Use Topics   ??? Alcohol use: Yes     Comment: seldom - once a year          Family History:  Family History   Problem Relation Age of Onset   ??? Cancer Mother    ??? Hypertension Mother    ??? Cancer Father    ??? Diabetes Father    ??? Hypertension Father        Review of Systems:  A detailed 10 system ROS is obtained, with pertinent positives as listed above.  All others are negative.    Diet:  NPO    Objective:     Physical Exam:  Vitals:  Visit Vitals  BP 122/56 (BP 1 Location: Right arm, BP Patient Position: At rest)   Pulse 72   Temp 98.2 ??F (36.8 ??C)   Resp 18   Ht 6\' 1"  (1.854 m)   Wt 79.4 kg (175 lb)   SpO2 100%   BMI 23.09 kg/m??     Gen:  Pt is alert, cooperative, no acute distress   Skin:  Extremities and face reveal no rashes.   HEENT: Sclerae anicteric.  Extra-occular muscles are intact.  No oral ulcers.  No abnormal pigmentation of the lips.  The neck is supple.  Cardiovascular: Regular rate and rhythm. No  gallops, or rubs. MURMUR III/VI  Respiratory:  Comfortable breathing with no accessory muscle use. Clear  breath sounds anteriorly with no wheezes, rales, or rhonchi.  GI:  Abdomen nondistended, soft, and  MILD TENDERNESS IN THE LLQ  Normal active bowel sounds. No enlargement of the liver or spleen. No masses palpable.  Rectal:  Deferred  Musculoskeletal:  1+ edema of the lower legs.    Neurological:  Gross memory appears intact.  Patient is alert and oriented.  Psychiatric:  Mood appears appropriate with judgement intact.  Lymphatic:  No cervical or supraclavicular adenopathy.    Laboratory:    Recent Labs     12/03/17  1408   WBC 6.1   HGB 9.4*   HCT 31.3*   PLT 187   MCV 78.3*   NA 141   K 3.9   CL 104   CO2 30   BUN 17  CREA 1.46   CA 8.7   GLU 111*   AP 66   SGOT 20   ALT 29   TBILI 0.4   ALB 3.4   TP 6.6          Assessment:     Active Problems:    GI bleed (12/03/2017)    77 y.o. male with PMH of (but not limited to) Crohn's ileitis with resection and currently not on medical treatment, A fib with multiple ablations on Eliquis and [facility] with [facility] appt with them next month, DM, HTN, Atrial valve repair followed by Dr. Jiles Prows. [place], PVC, Melanoma, Cardiac defribillator, Seizures, OSA, who is seen in consultation at the request of Dr.K. Mewborn for melena and anemia. He has a heme positive stool with "black tarry stools" over the last 4 days. Hgb on admission 9.4  with previous Hgb in our chart reveal Hgb 12-13 range.He has been on Eliquis and Aleve and likely has an UGIB. PUD, NSAID induced gastritis, Dieulafoy lesion and AVM are potential differentials.     Mr. Lorie PhenixSpangler has known Crohn's disease with previous resection of his small bowel. He was last evaluated in our office in 12/2016 by Dr. Marisa SprinklesMazanec and recommended Entyvio.He has failed Remicade previously and has been off all medical therapy for years. He had active disease on recent colonoscopy 06/2016.     Plan:     1.Agree with admission by hospital service  2.Type and Cross  3.Serial H&H and transfuse as needed  4.Iron/TIBC/ferritin   5.Pantoprazole 40mg  IV bid  6.Clear liquid diet  7.NPO after midnight  8.EGD tomorrow 12/03/17 by Dr. Reynold BowenI. Dedric Ethington. Risks and benefits discussed to include risk of infection, bleeding, perforation, anesthesia, and possible mortality. He verbalized understanding and wishes to proceed with EGD.  9.Hold Eliquis for procedure tomorrow    Reuel Boomracy M. Omelia BlackwaterLuther, FNP   [facility]  Patient is seen and examined in collaboration with Dr. Reynold BowenI. Angelisse Riso. Assessment and plan as per Dr. Reynold BowenI. Magaline Steinberg.     I have seen and examined the patient, and I have directed and agreed to the plan as described.  He has evidence of slow GI bleeding, perhaps with more recently.  This is complicated by therapeutic anticoagulation.  I strongly suspect his untreated Crohn's disease.    Bari Edward. Isaac Rayonna Heldman, MD

## 2017-12-03 NOTE — Progress Notes (Signed)
12/03/17 1750   Dual Skin Pressure Injury Assessment   Dual Skin Pressure Injury Assessment WDL   Second Care Provider (Based on Facility Policy) Miranda, RN    Skin Integumentary   Skin Integumentary (WDL) WDL   Wound Prevention and Protection Methods   Orientation of Wound Prevention Posterior   Location of Wound Prevention Sacrum/Coccyx   Dressing Present  No     Skin assessment completed with Miranda, RN. Skin warm and dry, No open or discolored areas noted. Pt also verbalized no skin abnormalities.

## 2017-12-03 NOTE — H&P (Signed)
H&P by Murvin Natal, NP  at 12/03/17 Cross                Author: Murvin Natal, NP  Service: Internal Medicine  Author Type: Nurse Practitioner       Filed: 12/03/17 1756  Date of Service: 12/03/17 1650  Status: Addendum          Editor: Murvin Natal, NP (Nurse Practitioner)       Related Notes: Original Note by Murvin Natal, NP (Nurse Practitioner) filed at 12/03/17 1713          Cosigner: Otilio Miu, MD at 12/04/17 0709                              Hospitalist H&P Note        Admit Date:  12/03/2017  2:07 PM    Name:  Lance Bradford    Age:  77 y.o.   DOB:  [birth year]    MRN:  [id number]    PCP:  Clarita Leber, MD   Treatment Team: Attending Provider: Verlon Au, MD; Primary Nurse: Terald Sleeper, RN        HPI:        Patient is a 77 yo male with significant PMH of crohn's ileitis with resection, afib on eliquis, aortic valve replacement, multiple ablations, ventricular arrhythmia s/p ICD, HTN, elevated lipids, CAD, AVM's, who came into the ER with reports of melena  stools x 5 days. Patient reports 4 stools yesterday and one today. Also reports of DOE, dizziness, BLE edema. Denies CP, weakness, cough. Does report lower abdominal tenderness. Hgb 9.4. GI consulted in ER. Patient scheduled for EGD in am.                10 systems reviewed and negative except as noted in HPI.     Past Medical History:        Diagnosis  Date         ?  Arthritis            hips and back          ?  Atrial fibrillation ([place])            several ablations          ?  Cancer ([place])            melenoma          ?  Cardiac defibrillator in situ            left side of chest  -never D/C         ?  Colon polyps            cancerous - removed          ?  Crohn disease ([place])       ?  Diabetes ([place])            type 2, adverage glucose 100-120, symtomatic below 70. last A1C ~ 7.1         ?  Hypertension            simce AVR BP is stable          ?  Liver disease            jaundice 1952         ?  Nausea &  vomiting  two occasions          ?  PVC (premature ventricular contraction)            one ablation done          ?  S/P AVR       ?  Seizures ([place])            most recent 04-24-16         ?  Sleep apnea            does not have to use C PAP after 20 pound weight loss            Past Surgical History:         Procedure  Laterality  Date          ?  COLONOSCOPY  N/A  06/28/2016          COLONOSCOPY/ 25 / PT HAS ICD performed by Baruch Merl, MD at [facility] ENDOSCOPY          ?  HX AORTIC VALVE REPLACEMENT         ?  HX APPENDECTOMY         ?  HX COLECTOMY              times 4 or 5 - different times           ?  HX COLONOSCOPY         ?  HX HEART CATHETERIZATION              times 2          ?  HX HEMORRHOIDECTOMY         ?  HX HERNIA REPAIR  Bilateral       ?  HX SHOULDER ARTHROSCOPY  Right            ?  HX VASECTOMY               Allergies        Allergen  Reactions         ?  Latex  Other (comments)             Blisters            ?  Codeine  Other (comments)             Chest pain          ?  Morphine  Other (comments)             Morphine doesn't work          ?  Prozac [Fluoxetine]  Other (comments)             "turn red in the sun"          ?  Tape [Adhesive]  Other (comments)             Blisters            Social History          Tobacco Use         ?  Smoking status:  Former Smoker              Packs/day:  1.00         Years:  13.00         Pack years:  13.00         Last attempt to quit:  07/10/1976  Years since quitting:  41.4         ?  Smokeless tobacco:  Never Used       Substance Use Topics         ?  Alcohol use:  Yes             Comment: seldom - once a year            Family History         Problem  Relation  Age of Onset          ?  Cancer  Mother       ?  Hypertension  Mother       ?  Cancer  Father       ?  Diabetes  Father            ?  Hypertension  Father              There is no immunization history on file for this patient.   PTA Medications:     Prior to Admission Medications      Prescriptions  Last Dose  Informant  Patient Reported?  Taking?      apixaban (ELIQUIS) 5 mg tablet      Yes  No      Sig: Take 5 mg by mouth two (2) times a day. Takes due to AVR    Last dose 05-23-16 -per pt , per Dr Guillermina [place]      ascorbic acid, vitamin C, (VITAMIN C) 500 mg tablet      Yes  No      Sig: Take 500 mg by mouth daily. Stop seven days prior to surgery per anesthesia protocol.      aspirin delayed-release 81 mg tablet      Yes  No      Sig: Take 81 mg by mouth daily. Take day of surgery per anesthesia protocol.      ferrous sulfate (IRON) 325 mg (65 mg iron) tablet      Yes  No      Sig: Take 65 mg by mouth Daily (before breakfast).      lacosamide (VIMPAT) 100 mg tab tablet      Yes  No      Sig: Take 100 mg by mouth two (2) times a day. Take day of surgery per anesthesia protocol.      metoprolol tartrate (LOPRESSOR) 25 mg tablet      Yes  No      Sig: Take 25 mg by mouth two (2) times a day. Take day of surgery per anesthesia protocol.      metroNIDAZOLE (FLAGYL) 500 mg tablet      Yes  No      Sig: Take 500 mg by mouth three (3) times daily.      multivitamin (ONE A DAY) tablet      Yes  No      Sig: Take 1 Tab by mouth daily. Stop seven days prior to surgery per anesthesia protocol.      potassium chloride SR (K-TAB) 10 mEq tablet      Yes  No      Sig: Take 20 mEq by mouth two (2) times a day.               Facility-Administered Medications: None             Objective:        Patient Vitals  for the past 24 hrs:            Temp  Pulse  Resp  BP  SpO2            12/03/17 1647  --  70  17  --  97 %            12/03/17 1627  --  --  --  129/63  99 %            12/03/17 1404  98.2 ??F (36.8 ??C)  72  18  122/56  100 %        Oxygen Therapy   O2 Sat (%): 97 % (12/03/17 1647)   Pulse via Oximetry: 71 beats per minute (12/03/17 1647)   O2 Device: Room air (12/03/17 1404)   No intake or output data in the 24 hours ending 12/03/17 1707     Physical Exam:   General:    Well nourished.  Alert. No distress  noted.    Eyes:   Normal sclera.  Extraocular movements intact.   ENT:  Normocephalic, atraumatic.  Moist mucous membranes   CV:   RRR.  No m/r/g.  Peripheral pulses 2+. Capillary refill <2s.   Lungs:  Rhonci, rales. Room air.    Abdomen: Soft, lower abdominal tenderness, nondistended. Bowel sounds normal.    Extremities: Warm and dry.  No cyanosis or edema.   Neurologic: CN II-XII grossly intact.  Sensation intact.   Skin:     No rashes or jaundice.  Normal coloration   Psych:  Normal mood and affect.      I reviewed the labs, imaging, EKGs, telemetry, and other studies done this admission.   Data Review:      Recent Results (from the past 24 hour(s))     CBC WITH AUTOMATED DIFF          Collection Time: 12/03/17  2:08 PM         Result  Value  Ref Range            WBC  6.1  4.3 - 11.1 K/uL       RBC  4.00 (L)  4.23 - 5.6 M/uL       HGB  9.4 (L)  13.6 - 17.2 g/dL       HCT  31.3 (L)  41.1 - 50.3 %       MCV  78.3 (L)  79.6 - 97.8 FL       MCH  23.5 (L)  26.1 - 32.9 PG       MCHC  30.0 (L)  31.4 - 35.0 g/dL       RDW  16.5 (H)  11.9 - 14.6 %       PLATELET  187  150 - 450 K/uL       MPV  10.3  9.4 - 12.3 FL       ABSOLUTE NRBC  0.00  0.0 - 0.2 K/uL       DF  AUTOMATED          NEUTROPHILS  71  43 - 78 %       LYMPHOCYTES  20  13 - 44 %       MONOCYTES  6  4.0 - 12.0 %       EOSINOPHILS  3  0.5 - 7.8 %       BASOPHILS  1  0.0 - 2.0 %       IMMATURE  GRANULOCYTES  0  0.0 - 5.0 %       ABS. NEUTROPHILS  4.3  1.7 - 8.2 K/UL       ABS. LYMPHOCYTES  1.2  0.5 - 4.6 K/UL       ABS. MONOCYTES  0.4  0.1 - 1.3 K/UL       ABS. EOSINOPHILS  0.2  0.0 - 0.8 K/UL       ABS. BASOPHILS  0.1  0.0 - 0.2 K/UL       ABS. IMM. GRANS.  0.0  0.0 - 0.5 K/UL       METABOLIC PANEL, COMPREHENSIVE          Collection Time: 12/03/17  2:08 PM         Result  Value  Ref Range            Sodium  141  136 - 145 mmol/L       Potassium  3.9  3.5 - 5.1 mmol/L       Chloride  104  98 - 107 mmol/L       CO2  30  21 - 32 mmol/L       Anion gap  7  7 - 16  mmol/L       Glucose  111 (H)  65 - 100 mg/dL       BUN  17  8 - 23 MG/DL            Creatinine  1.46  0.8 - 1.5 MG/DL            GFR est AA  >60  >60 ml/min/1.34m       GFR est non-AA  50 (L)  >60 ml/min/1.724m      Calcium  8.7  8.3 - 10.4 MG/DL       Bilirubin, total  0.4  0.2 - 1.1 MG/DL       ALT (SGPT)  29  12 - 65 U/L       AST (SGOT)  20  15 - 37 U/L       Alk. phosphatase  66  50 - 136 U/L       Protein, total  6.6  6.3 - 8.2 g/dL       Albumin  3.4  3.2 - 4.6 g/dL       Globulin  3.2  2.3 - 3.5 g/dL       A-G Ratio  1.1 (L)  1.2 - 3.5         HGB & HCT          Collection Time: 12/03/17  4:41 PM         Result  Value  Ref Range            HGB  9.5 (L)  13.6 - 17.2 g/dL            HCT  31.5 (L)  41.1 - 50.3 %             All Micro Results           None                  Other Studies:   No results found.        Assessment and Plan:           Hospital Problems  as of 12/03/2017  Date Reviewed:  12/03/2017  Codes  Class  Noted - Resolved  POA              * (Principal) GI bleed  ICD-10-CM: K92.2   ICD-9-CM: 578.9    12/03/2017 - Present  Yes                        A-fib ([place])  ICD-10-CM: I48.91   ICD-9-CM: 427.31    12/03/2017 - Present  Unknown                        Crohn's disease ([place])  ICD-10-CM: K50.90   ICD-9-CM: 555.9    12/03/2017 - Present  Unknown                        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  ICD-10-CM: Z45.02   ICD-9-CM: V53.32    12/03/2017 - Present  Unknown                        Seizure ([place])  ICD-10-CM: R56.9   ICD-9-CM: 780.39    12/03/2017 - Present  Unknown                        HTN (hypertension)  ICD-10-CM: I10   ICD-9-CM: 401.9    12/03/2017 - Present  Unknown                        S/P AVR  ICD-10-CM: Z95.2   ICD-9-CM: V43.3    12/03/2017 - Present  Unknown                          PLAN:   GI Bleed   -transfuse 1 unit PRBC   -GI consult   -CLD, NPO after midnight   -Hgb & Hct   -CBC and BMP in am   -EGD in am   -PPI      Hx  Crohn's disease   -GI consulting      Afib on Eliquis   -hold elquis until cleared by GI   -continue amiodarone      S/P AVR with BLE edema   -66m IV lasix   -hold IVF       CAD   -hold ASA    -continue imdur    -Continue statin      Seizure hx   -continue home medcation       Discharge planning:  home   DVT ppx: SCD's   Code status:  Full   Estimated LOS:  Greater than 2 midnights   Risk:  high      Plan of care discussed with Dr. BRay Church    Signed:   CMurvin Natal NP-C

## 2017-12-03 NOTE — ED Provider Notes (Signed)
The history is provided by the patient.   Rectal Bleeding    This is a new problem. Episode onset: about 4 days ago. Stool description: Black. Associated symptoms include diarrhea (rather chronic from Crohn's disease). Pertinent negatives include no abdominal pain, no dysuria, no chills, no fever, no nausea, no back pain, no vomiting and no constipation. Risk factors: anticoagulated for atrial fibrillation, history of GI bleed in the past due to AV malformation patient ??2. He has tried nothing for the symptoms. Past medical history comments: atrial fibrillation, Crohn's disease, AV malformation.        Past Medical History:   Diagnosis Date   ??? Arthritis     hips and back    ??? Atrial fibrillation (HCC)     several ablations    ??? Cancer ([place])     melenoma    ??? 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 Colon polyps     cancerous - removed    ??? Crohn disease ([place])    ??? Diabetes ([place])     type 2, adverage glucose 100-120, symtomatic below 70. last A1C ~ 7.1   ??? Hypertension     simce AVR BP is stable    ??? Liver disease     jaundice 1952   ??? Nausea & vomiting     two occasions    ??? PVC (premature ventricular contraction)     one ablation done    ??? S/P AVR    ??? Seizures ([place])     most recent 04-24-16   ??? Sleep apnea     does not have to use C PAP after 20 pound weight loss        Past Surgical History:   Procedure Laterality Date   ??? COLONOSCOPY N/A 06/28/2016    COLONOSCOPY/ 25 / PT HAS ICD performed by Baruch Merl, MD at [facility] ENDOSCOPY   ??? HX AORTIC VALVE REPLACEMENT     ??? HX APPENDECTOMY     ??? HX COLECTOMY      times 4 or 5 - different times    ??? HX COLONOSCOPY     ??? HX HEART CATHETERIZATION      times 2   ??? HX HEMORRHOIDECTOMY     ??? HX HERNIA REPAIR Bilateral    ??? HX SHOULDER ARTHROSCOPY Right    ??? HX VASECTOMY           Family History:   Problem Relation Age of Onset   ??? Cancer Mother    ??? Hypertension Mother    ??? Cancer Father    ??? Diabetes Father    ??? Hypertension Father        Social  History     Socioeconomic History   ??? Marital status: MARRIED     Spouse name: Not on file   ??? Number of children: Not on file   ??? Years of education: Not on file   ??? Highest education level: Not on file   Occupational History   ??? Not on file   Social Needs   ??? Financial resource strain: Not on file   ??? Food insecurity:     Worry: Not on file     Inability: Not on file   ??? Transportation needs:     Medical: Not on file     Non-medical: Not on file   Tobacco Use   ??? Smoking status: Former Smoker  Packs/day: 1.00     Years: 13.00     Pack years: 13.00     Last attempt to quit: 07/10/1976     Years since quitting: 41.4   ??? Smokeless tobacco: Never Used   Substance and Sexual Activity   ??? Alcohol use: Yes     Comment: seldom - once a year    ??? Drug use: No   ??? Sexual activity: Not on file   Lifestyle   ??? Physical activity:     Days per week: Not on file     Minutes per session: Not on file   ??? Stress: Not on file   Relationships   ??? Social connections:     Talks on phone: Not on file     Gets together: Not on file     Attends religious service: Not on file     Active member of club or organization: Not on file     Attends meetings of clubs or organizations: Not on file     Relationship status: Not on file   ??? Intimate partner violence:     Fear of current or ex partner: Not on file     Emotionally abused: Not on file     Physically abused: Not on file     Forced sexual activity: Not on file   Other Topics Concern   ??? Not on file   Social History Narrative   ??? Not on file         ALLERGIES: Latex; Codeine; Morphine; Prozac [fluoxetine]; and Tape [adhesive]    Review of Systems   Constitutional: Negative for chills and fever.   HENT: Negative for congestion and rhinorrhea.    Respiratory: Negative for cough and shortness of breath.    Cardiovascular: Negative for chest pain.   Gastrointestinal: Positive for anal bleeding, blood in stool (Black stool for 4 days and was heme positive today) and diarrhea (rather chronic  from Crohn's disease). Negative for abdominal pain, constipation, nausea and vomiting.   Endocrine: Negative for polyuria.   Genitourinary: Negative for dysuria, frequency and hematuria.   Musculoskeletal: Negative for back pain.   Neurological: Positive for light-headedness. Negative for weakness and numbness.       Vitals:    12/03/17 1404   BP: 122/56   Pulse: 72   Resp: 18   Temp: 98.2 ??F (36.8 ??C)   SpO2: 100%   Weight: 79.4 kg (175 lb)   Height: '6\' 1"'  (1.854 m)            Physical Exam   Constitutional: He is oriented to person, place, and time. He appears well-developed and well-nourished.   HENT:   Mouth/Throat: Oropharynx is clear and moist. No oropharyngeal exudate.   Eyes: Pupils are equal, round, and reactive to light. Conjunctivae are normal.   Neck: Neck supple.   Cardiovascular: Normal rate, regular rhythm and normal heart sounds.   Pulmonary/Chest: Effort normal and breath sounds normal.   Abdominal: Soft. Bowel sounds are normal. He exhibits no distension. There is no tenderness. There is no rebound and no guarding.   Musculoskeletal: Normal range of motion. He exhibits no edema or tenderness.   Lymphadenopathy:     He has no cervical adenopathy.   Neurological: He is alert and oriented to person, place, and time.   Skin: Skin is warm and dry.   Nursing note and vitals reviewed.       MDM  Number of Diagnoses or Management Options  Diagnosis management comments: Vital signs are normal although patient is on a beta blocker, but he does not appear pale and conjunctiva do not appear pale.  Check hemoglobin and discuss with on-call GI physician.  Patient sees Dr[facility].    3:36 PM  Hemoglobin 9.4 baseline is closer to 11 but has trended down over the last couple of months.  Review of old records at other facilities was required for this. Discussed with GI and patient to be admitted by the hospitalist service.       Amount and/or Complexity of Data Reviewed  Clinical lab tests:  ordered and reviewed (Results for orders placed or performed during the hospital encounter of 12/03/17  -CBC WITH AUTOMATED DIFF       Result                      Value             Ref Range           WBC                         6.1               4.3 - 11.1 K*       RBC                         4.00 (L)          4.23 - 5.6 M*       HGB                         9.4 (L)           13.6 - 17.2 *       HCT                         31.3 (L)          41.1 - 50.3 %       MCV                         78.3 (L)          79.6 - 97.8 *       MCH                         23.5 (L)          26.1 - 32.9 *       MCHC                        30.0 (L)          31.4 - 35.0 *       RDW                         16.5 (H)          11.9 - 14.6 %       PLATELET                    187               150 - 450 K/*       MPV  10.3              9.4 - 12.3 FL       ABSOLUTE NRBC               0.00              0.0 - 0.2 K/*       DF                          AUTOMATED                             NEUTROPHILS                 71                43 - 78 %           LYMPHOCYTES                 20                13 - 44 %           MONOCYTES                   6                 4.0 - 12.0 %        EOSINOPHILS                 3                 0.5 - 7.8 %         BASOPHILS                   1                 0.0 - 2.0 %         IMMATURE GRANULOCYTES       0                 0.0 - 5.0 %         ABS. NEUTROPHILS            4.3               1.7 - 8.2 K/*       ABS. LYMPHOCYTES            1.2               0.5 - 4.6 K/*       ABS. MONOCYTES              0.4               0.1 - 1.3 K/*       ABS. EOSINOPHILS            0.2               0.0 - 0.8 K/*       ABS. BASOPHILS              0.1               0.0 - 0.2 K/*       ABS. IMM. GRANS.            0.0  0.0 - 0.5 K/*  -METABOLIC PANEL, COMPREHENSIVE       Result                      Value             Ref Range           Sodium                      141               136 - 145 mm*        Potassium                   3.9               3.5 - 5.1 mm*       Chloride                    104               98 - 107 mmo*       CO2                         30                21 - 32 mmol*       Anion gap                   7                 7 - 16 mmol/L       Glucose                     111 (H)           65 - 100 mg/*       BUN                         17                8 - 23 MG/DL        Creatinine                  1.46              0.8 - 1.5 MG*       GFR est AA                  >60               >60 ml/min/1*       GFR est non-AA              50 (L)            >60 ml/min/1*       Calcium                     8.7               8.3 - 10.4 M*       Bilirubin, total            0.4               0.2 - 1.1 MG*       ALT (SGPT)  29                12 - 65 U/L         AST (SGOT)                  20                15 - 37 U/L         Alk. phosphatase            66                50 - 136 U/L        Protein, total              6.6               6.3 - 8.2 g/*       Albumin                     3.4               3.2 - 4.6 g/*       Globulin                    3.2               2.3 - 3.5 g/*       A-G Ratio                   1.1 (L)           1.2 - 3.5      )  Review and summarize past medical records: yes  Discuss the patient with other providers: yes (Discussed with Dr. Vivi Barrack with the [facility].  Admit to hospitalist.  GI will follow in consult.)           Procedures

## 2017-12-03 NOTE — ED Notes (Signed)
 TRANSFER - OUT REPORT:    Verbal report given to Luke, RN(name) on Jeremiah Curci  being transferred to 615(unit) for routine progression of care       Report consisted of patient's Situation, Background, Assessment and   Recommendations(SBAR).     Information from the following report(s) SBAR, Kardex, ED Summary, [facility] and Recent Results was reviewed with the receiving nurse.    Lines:   Peripheral IV 12/03/17 Left Antecubital (Active)        Opportunity for questions and clarification was provided.      Patient transported with:   [employer]

## 2017-12-03 NOTE — Progress Notes (Signed)
12/03/17 1750   Dual Skin Pressure Injury Assessment   Dual Skin Pressure Injury Assessment WDL   Second Care Provider (Based on Facility Policy) Miranda, RN    Skin Integumentary   Skin Integumentary (WDL) WDL   Wound Prevention and Protection Methods   Orientation of Wound Prevention Posterior   Location of Wound Prevention Sacrum/Coccyx   Dressing Present  No     Skin assessment completed with Miranda, RN. Skin warm and dry, No open or discolored areas noted. Pt also verbalized no skin abnormalities.

## 2017-12-03 NOTE — Consults (Signed)
Consults  by Montel Culver, MD at 12/03/17 1546                Author: Montel Culver, MD  Service: Gastroenterology  Author Type: Physician       Filed: 12/04/17 0804  Date of Service: 12/03/17 1546  Status: Addendum          Editor: Montel Culver, MD (Physician)          Related Notes: Original Note by Annetta Maw, NP (Nurse Practitioner) filed at 12/03/17 1612                  [facility] Consult Note            Primary GI Physician: Dr. Piedad Climes      Referring Provider: Dr. Kirtland Bouchard. Mewborn, ED      Consult Date:  12/03/2017      Admit Date:  12/03/2017      Chief Complaint:  Melena        Subjective:        History of Present Illness:  Patient is a 77 y.o.  male with PMH of (but not limited to) Crohn's ileitis with resection and currently not on medical treatment, A fib with multiple ablations on  Eliquis and followed at [facility] with return appt with them next month, DM, HTN, Atrial valve repair followed by Dr. Jiles Prows, PVC, Melanoma, Cardiac defribillator, Seizures, OSA, who is seen in consultation at the request of Dr.K. Mewborn for melena and  anemia. Mr. Pantoja has noticed increasing fatigue and exercise intolerance. He has had increasing dyspnea and edema. He developed "black tarry stools" 4 days ago with up to 3 stools in a day prompting a visit to the ED. He had a hemocult card at home  and tested his stool which was positive. He has had some left lower abdominal tenderness, but denies any epigastric abdominal pain, N&V, GERD, Dysphagia or new weight loss (he lost weight after atrial valve repair, but had maintained it). He takes Eliquis  daily and Aleve 2 pills every 2 weeks. He denies any tobacco or ETOH. Hgb on admission 9.4, Hct 31.3, MCV 78.3, RDW 16.5, plt 187. Previous Hgb in our chart reveal Hgb 12-13 range. BUN 17, creat 1.46. He is being admitted by the hospital service.       Mr. Markus has known Crohn's disease with previous resection of his small  bowel. He was last evaluated in our office in 12/2016 by Dr. Marisa Sprinkles and recommended Entyvio. 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 He has been on Remicade in the past approximately 20 years ago and has been on no medical therapy in years. He has 4-5 BM daily, no nocturnal and no hematochezia.       EGD on 06/28/16 by Dr. Marisa Sprinkles revealed mild gastritis and small hiatal hernia   Colonoscopy on 06/28/17 by Dr. Piedad Climes revealed moderate number of ulcers at the ileum and mild stenosis. 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 Pathology revealed chronic and acute changes. Repeat in 3 years.       PMH:     Past Medical History:        Diagnosis  Date         ?  Arthritis            hips and back          ?  Atrial fibrillation (HCC)            several ablations          ?  Cancer (HCC)            melenoma          ?  Cardiac defibrillator in situ            left side of chest  -never D/C         ?  Colon polyps            cancerous - removed          ?  Crohn disease (HCC)       ?  Diabetes (HCC)            type 2, adverage glucose 100-120, symtomatic below 70. last A1C ~ 7.1         ?  Hypertension            simce AVR BP is stable          ?  Liver disease            jaundice 1952         ?  Nausea & vomiting            two occasions          ?  PVC (premature ventricular contraction)            one ablation done          ?  S/P AVR       ?  Seizures (HCC)            most recent 04-24-16         ?  Sleep apnea            does not have to use C PAP after 20 pound weight loss            PSH:     Past Surgical History:         Procedure  Laterality  Date          ?  COLONOSCOPY  N/A  06/28/2016          COLONOSCOPY/ 25 / PT HAS ICD [facility]          ?  HX AORTIC VALVE REPLACEMENT         ?  HX APPENDECTOMY         ?  HX COLECTOMY               times 4 or 5 - different times           ?  HX COLONOSCOPY         ?  HX HEART CATHETERIZATION              times 2          ?  HX HEMORRHOIDECTOMY         ?  HX HERNIA REPAIR  Bilateral       ?  HX SHOULDER ARTHROSCOPY  Right            ?  HX VASECTOMY               Allergies:     Allergies        Allergen  Reactions         ?  Latex  Other (  comments)             Blisters            ?  Codeine  Other (comments)             Chest pain          ?  Morphine  Other (comments)             Morphine doesn't work          ?  Prozac [Fluoxetine]  Other (comments)             "turn red in the sun"          ?  Tape [Adhesive]  Other (comments)             Blisters            Home Medications:     Prior to Admission medications             Medication  Sig  Start Date  End Date  Taking?  Authorizing Provider            metoprolol tartrate (LOPRESSOR) 25 mg tablet  Take 25 mg by mouth two (2) times a day. Take day of surgery per anesthesia protocol.        Provider, Historical     potassium chloride SR (K-TAB) 10 mEq tablet  Take 20 mEq by mouth two (2) times a day.        Provider, Historical     apixaban (ELIQUIS) 5 mg tablet  Take 5 mg by mouth two (2) times a day. Takes due to AVR    Last dose 05-23-16 -per pt , per Dr Daisey Must        Provider, Historical     multivitamin (ONE A DAY) tablet  Take 1 Tab by mouth daily. Stop seven days prior to surgery per anesthesia protocol.        Provider, Historical     lacosamide (VIMPAT) 100 mg tab tablet  Take 100 mg by mouth two (2) times a day. Take day of surgery per anesthesia protocol.        Provider, Historical     ferrous sulfate (IRON) 325 mg (65 mg iron) tablet  Take 65 mg by mouth Daily (before breakfast).        Provider, Historical     aspirin delayed-release 81 mg tablet  Take 81 mg by mouth daily. Take day of surgery per anesthesia protocol.        Provider, Historical     metroNIDAZOLE (FLAGYL) 500 mg tablet  Take 500 mg by mouth three (3) times daily.        Provider,  Historical            ascorbic acid, vitamin C, (VITAMIN C) 500 mg tablet  Take 500 mg by mouth daily. Stop seven days prior to surgery per anesthesia protocol.        Provider, Historical           Hospital Medications:     Current Facility-Administered Medications          Medication  Dose  Route  Frequency           ?  0.9% sodium chloride infusion 250 mL   250 mL  IntraVENous  PRN          Current Outpatient Medications        Medication  Sig         ?  metoprolol tartrate (LOPRESSOR) 25 mg tablet  Take 25 mg by mouth two (2) times a day. Take day of surgery per anesthesia protocol.     ?  potassium chloride SR (K-TAB) 10 mEq tablet  Take 20 mEq by mouth two (2) times a day.     ?  apixaban (ELIQUIS) 5 mg tablet  Take 5 mg by mouth two (2) times a day. Takes due to AVR    Last dose 05-23-16 -per pt , per Dr Daisey Must     ?  multivitamin (ONE A DAY) tablet  Take 1 Tab by mouth daily. Stop seven days prior to surgery per anesthesia protocol.     ?  lacosamide (VIMPAT) 100 mg tab tablet  Take 100 mg by mouth two (2) times a day. Take day of surgery per anesthesia protocol.     ?  ferrous sulfate (IRON) 325 mg (65 mg iron) tablet  Take 65 mg by mouth Daily (before breakfast).     ?  aspirin delayed-release 81 mg tablet  Take 81 mg by mouth daily. Take day of surgery per anesthesia protocol.     ?  metroNIDAZOLE (FLAGYL) 500 mg tablet  Take 500 mg by mouth three (3) times daily.         ?  ascorbic acid, vitamin C, (VITAMIN C) 500 mg tablet  Take 500 mg by mouth daily. Stop seven days prior to surgery per anesthesia protocol.           Social History:     Social History          Tobacco Use         ?  Smoking status:  Former Smoker              Packs/day:  1.00         Years:  13.00         Pack years:  13.00         Last attempt to quit:  07/10/1976         Years since quitting:  41.4         ?  Smokeless tobacco:  Never Used       Substance Use Topics         ?  Alcohol use:  Yes             Comment: seldom - once a  year               Family History:     Family History         Problem  Relation  Age of Onset          ?  Cancer  Mother       ?  Hypertension  Mother       ?  Cancer  Father       ?  Diabetes  Father            ?  Hypertension  Father             Review of Systems:   A detailed 10 system ROS is obtained, with pertinent positives as listed above.  All others are negative.      Diet:  NPO        Objective:        Physical Exam:   Vitals:   Visit Vitals      BP  122/56 (BP 1 Location: Right arm, BP Patient Position: At rest)  Pulse  72     Temp  98.2 ??F (36.8 ??C)     Resp  18     Ht  6\' 1"  (1.854 m)     Wt  79.4 kg (175 lb)     SpO2  100%        BMI  23.09 kg/m??        Gen:  Pt is alert, cooperative, no acute distress    Skin:  Extremities and face reveal no rashes.    HEENT: Sclerae anicteric.  Extra-occular muscles are intact.  No oral ulcers.  No abnormal pigmentation of the lips.  The neck is supple.   Cardiovascular: Regular rate and rhythm. No  gallops, or rubs. MURMUR III/VI   Respiratory:  Comfortable breathing with no accessory muscle use. Clear breath sounds anteriorly with no wheezes, rales, or rhonchi.   GI:  Abdomen nondistended, soft, and  MILD TENDERNESS IN THE LLQ  Normal active bowel sounds. No enlargement of the liver or spleen. No masses  palpable.   Rectal:  Deferred   Musculoskeletal:  1+ edema of the lower legs.     Neurological:  Gross memory appears intact.  Patient is alert and oriented.   Psychiatric:  Mood appears appropriate with judgement intact.   Lymphatic:  No cervical or supraclavicular adenopathy.      Laboratory:       Recent Labs           12/03/17   1408     WBC  6.1     HGB  9.4*     HCT  31.3*     PLT  187     MCV  78.3*     NA  141     K  3.9     CL  104     CO2  30     BUN  17     CREA  1.46     CA  8.7     GLU  111*     AP  66     SGOT  20     ALT  29     TBILI  0.4     ALB  3.4        TP  6.6                 Assessment:        Active Problems:     GI bleed (12/03/2017)       77 y.o. male with PMH of (but not limited to) Crohn's ileitis with resection and currently not on medical treatment, A fib with multiple ablations on Eliquis and followed at [facility] with return appt with  them next month, DM, HTN, Atrial valve repair followed by Dr. Jiles Prows, PVC, Melanoma, Cardiac defribillator, Seizures, OSA, who is seen in consultation at the request of Dr.K. Mewborn for melena and anemia. He has a heme positive stool with "black  tarry stools" over the last 4 days. Hgb on admission 9.4  with previous Hgb in our chart reveal Hgb 12-13 range.He has been on Eliquis and Aleve and likely has an UGIB. PUD, NSAID induced gastritis, Dieulafoy lesion and AVM are potential differentials.       Mr. Sanagustin has known Crohn's disease with previous resection of his small bowel. He was last evaluated in our office in 12/2016 by Dr. Marisa Sprinkles and recommended Entyvio.He has failed Remicade previously and has been off all medical therapy for years.  He  had active disease on recent colonoscopy 06/2016.         Plan:        1.Agree with admission by hospital service   2.Type and Cross   3.Serial H&H and transfuse as needed   4.Iron/TIBC/ferritin   5.Pantoprazole 40mg  IV bid   6.Clear liquid diet   7.NPO after midnight   8.EGD tomorrow 12/03/17 by Dr. Reynold Bowen. Risks and benefits discussed to include risk of infection, bleeding, perforation, anesthesia, and possible mortality. He verbalized understanding and wishes to proceed with EGD.   9.Hold Eliquis for procedure tomorrow      Reuel Boom. Omelia Blackwater, FNP    [facility]   Patient is seen and examined in collaboration with Dr. Reynold Bowen. Assessment and plan as per Dr. Reynold Bowen.       I have seen and examined the patient, and I have directed and agreed to the plan as described.  He has evidence of slow GI bleeding, perhaps with more recently.  This is complicated by therapeutic anticoagulation.  I strongly  suspect his untreated Crohn's  disease.      Bari Edward, MD

## 2017-12-03 NOTE — Progress Notes (Signed)
PTA medlist updated. Adine Maduraandra, NP notified of changes made.

## 2017-12-03 NOTE — ED Notes (Signed)
Pt states he has had a lot of rectal bleeding for the last 5 days.  Describes stools as black.  Has hx of hemorrhoids.  Has had aortic valve replacement and is on eliquis.  No pepto bismol.  Denies nausea/vomiting but is having dizziness.

## 2017-12-03 NOTE — Progress Notes (Signed)
PTA medlist updated. Candra, NP notified of changes made.

## 2017-12-03 NOTE — H&P (Addendum)
Hospitalist H&P Note     Admit Date:  12/03/2017  2:07 PM   Name:  Lance Bradford   Age:  77 y.o.  DOB:  08-30-1940   MRN:  [id number]   PCP:  Clarita Leber, MD  Treatment Team: Attending Provider: Verlon Au, MD; Primary Nurse: Terald Sleeper, RN    HPI:     Patient is a 77 yo male with significant PMH of crohn's ileitis with resection, afib on eliquis, aortic valve replacement, multiple ablations, ventricular arrhythmia s/p ICD, HTN, elevated lipids, CAD, AVM's, who came into the ER with reports of melena stools x 5 days. Patient reports 4 stools yesterday and one today. Also reports of DOE, dizziness, BLE edema. Denies CP, weakness, cough. Does report lower abdominal tenderness. Hgb 9.4. GI consulted in ER. Patient scheduled for EGD in am.           10 systems reviewed and negative except as noted in HPI.  Past Medical History:   Diagnosis Date   ??? Arthritis     hips and back    ??? Atrial fibrillation (HCC)     several ablations    ??? Cancer ([place])     melenoma    ??? 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 Colon polyps     cancerous - removed    ??? Crohn disease ([place])    ??? Diabetes ([place])     type 2, adverage glucose 100-120, symtomatic below 70. last A1C ~ 7.1   ??? Hypertension     simce AVR BP is stable    ??? Liver disease     jaundice 1952   ??? Nausea & vomiting     two occasions    ??? PVC (premature ventricular contraction)     one ablation done    ??? S/P AVR    ??? Seizures ([place])     most recent 04-24-16   ??? Sleep apnea     does not have to use C PAP after 20 pound weight loss       Past Surgical History:   Procedure Laterality Date   ??? COLONOSCOPY N/A 06/28/2016    COLONOSCOPY/ 25 / PT HAS ICD performed by Baruch Merl, MD at [facility] ENDOSCOPY   ??? HX AORTIC VALVE REPLACEMENT     ??? HX APPENDECTOMY     ??? HX COLECTOMY      times 4 or 5 - different times    ??? HX COLONOSCOPY     ??? HX HEART CATHETERIZATION      times 2   ??? HX HEMORRHOIDECTOMY      ??? HX HERNIA REPAIR Bilateral    ??? HX SHOULDER ARTHROSCOPY Right    ??? HX VASECTOMY        Allergies   Allergen Reactions   ??? Latex Other (comments)     Blisters     ??? Codeine Other (comments)     Chest pain    ??? Morphine Other (comments)     Morphine doesn't work    ??? Prozac [Fluoxetine] Other (comments)     "turn red in the sun"    ??? Tape [Adhesive] Other (comments)     Blisters       Social History     Tobacco Use   ??? Smoking status: Former Smoker     Packs/day: 1.00     Years: 13.00     Pack  years: 13.00     Last attempt to quit: 07/10/1976     Years since quitting: 41.4   ??? Smokeless tobacco: Never Used   Substance Use Topics   ??? Alcohol use: Yes     Comment: seldom - once a year       Family History   Problem Relation Age of Onset   ??? Cancer Mother    ??? Hypertension Mother    ??? Cancer Father    ??? Diabetes Father    ??? Hypertension Father         There is no immunization history on file for this patient.  PTA Medications:  Prior to Admission Medications   Prescriptions Last Dose Informant Patient Reported? Taking?   apixaban (ELIQUIS) 5 mg tablet   Yes No   Sig: Take 5 mg by mouth two (2) times a day. Takes due to AVR   Last dose 05-23-16 -per pt , per Dr Guillermina [place]   ascorbic acid, vitamin C, (VITAMIN C) 500 mg tablet   Yes No   Sig: Take 500 mg by mouth daily. Stop seven days prior to surgery per anesthesia protocol.   aspirin delayed-release 81 mg tablet   Yes No   Sig: Take 81 mg by mouth daily. Take day of surgery per anesthesia protocol.   ferrous sulfate (IRON) 325 mg (65 mg iron) tablet   Yes No   Sig: Take 65 mg by mouth Daily (before breakfast).   lacosamide (VIMPAT) 100 mg tab tablet   Yes No   Sig: Take 100 mg by mouth two (2) times a day. Take day of surgery per anesthesia protocol.   metoprolol tartrate (LOPRESSOR) 25 mg tablet   Yes No   Sig: Take 25 mg by mouth two (2) times a day. Take day of surgery per anesthesia protocol.   metroNIDAZOLE (FLAGYL) 500 mg tablet   Yes No    Sig: Take 500 mg by mouth three (3) times daily.   multivitamin (ONE A DAY) tablet   Yes No   Sig: Take 1 Tab by mouth daily. Stop seven days prior to surgery per anesthesia protocol.   potassium chloride SR (K-TAB) 10 mEq tablet   Yes No   Sig: Take 20 mEq by mouth two (2) times a day.      Facility-Administered Medications: None       Objective:     Patient Vitals for the past 24 hrs:   Temp Pulse Resp BP SpO2   12/03/17 1647 ??? 70 17 ??? 97 %   12/03/17 1627 ??? ??? ??? 129/63 99 %   12/03/17 1404 98.2 ??F (36.8 ??C) 72 18 122/56 100 %     Oxygen Therapy  O2 Sat (%): 97 % (12/03/17 1647)  Pulse via Oximetry: 71 beats per minute (12/03/17 1647)  O2 Device: Room air (12/03/17 1404)  No intake or output data in the 24 hours ending 12/03/17 1707    Physical Exam:  General:    Well nourished.  Alert. No distress noted.   Eyes:   Normal sclera.  Extraocular movements intact.  ENT:  Normocephalic, atraumatic.  Moist mucous membranes  CV:   RRR.  No m/r/g.  Peripheral pulses 2+. Capillary refill <2s.  Lungs:  Rhonci, rales. Room air.   Abdomen: Soft, lower abdominal tenderness, nondistended. Bowel sounds normal.   Extremities: Warm and dry.  No cyanosis or edema.  Neurologic: CN II-XII grossly intact.  Sensation intact.  Skin:  No rashes or jaundice.  Normal coloration  Psych:  Normal mood and affect.    I reviewed the labs, imaging, EKGs, telemetry, and other studies done this admission.  Data Review:   Recent Results (from the past 24 hour(s))   CBC WITH AUTOMATED DIFF    Collection Time: 12/17/2017  2:08 PM   Result Value Ref Range    WBC 6.1 4.3 - 11.1 K/uL    RBC 4.00 (L) 4.23 - 5.6 M/uL    HGB 9.4 (L) 13.6 - 17.2 g/dL    HCT 31.3 (L) 41.1 - 50.3 %    MCV 78.3 (L) 79.6 - 97.8 FL    MCH 23.5 (L) 26.1 - 32.9 PG    MCHC 30.0 (L) 31.4 - 35.0 g/dL    RDW 16.5 (H) 11.9 - 14.6 %    PLATELET 187 150 - 450 K/uL    MPV 10.3 9.4 - 12.3 FL    ABSOLUTE NRBC 0.00 0.0 - 0.2 K/uL    DF AUTOMATED      NEUTROPHILS 71 43 - 78 %     LYMPHOCYTES 20 13 - 44 %    MONOCYTES 6 4.0 - 12.0 %    EOSINOPHILS 3 0.5 - 7.8 %    BASOPHILS 1 0.0 - 2.0 %    IMMATURE GRANULOCYTES 0 0.0 - 5.0 %    ABS. NEUTROPHILS 4.3 1.7 - 8.2 K/UL    ABS. LYMPHOCYTES 1.2 0.5 - 4.6 K/UL    ABS. MONOCYTES 0.4 0.1 - 1.3 K/UL    ABS. EOSINOPHILS 0.2 0.0 - 0.8 K/UL    ABS. BASOPHILS 0.1 0.0 - 0.2 K/UL    ABS. IMM. GRANS. 0.0 0.0 - 0.5 K/UL   METABOLIC PANEL, COMPREHENSIVE    Collection Time: 12-17-17  2:08 PM   Result Value Ref Range    Sodium 141 136 - 145 mmol/L    Potassium 3.9 3.5 - 5.1 mmol/L    Chloride 104 98 - 107 mmol/L    CO2 30 21 - 32 mmol/L    Anion gap 7 7 - 16 mmol/L    Glucose 111 (H) 65 - 100 mg/dL    BUN 17 8 - 23 MG/DL    Creatinine 1.46 0.8 - 1.5 MG/DL    GFR est AA >60 >60 ml/min/1.61m    GFR est non-AA 50 (L) >60 ml/min/1.770m   Calcium 8.7 8.3 - 10.4 MG/DL    Bilirubin, total 0.4 0.2 - 1.1 MG/DL    ALT (SGPT) 29 12 - 65 U/L    AST (SGOT) 20 15 - 37 U/L    Alk. phosphatase 66 50 - 136 U/L    Protein, total 6.6 6.3 - 8.2 g/dL    Albumin 3.4 3.2 - 4.6 g/dL    Globulin 3.2 2.3 - 3.5 g/dL    A-G Ratio 1.1 (L) 1.2 - 3.5     HGB & HCT    Collection Time: 0708-06-194:41 PM   Result Value Ref Range    HGB 9.5 (L) 13.6 - 17.2 g/dL    HCT 31.5 (L) 41.1 - 50.3 %       All Micro Results     None          Other Studies:  No results found.    Assessment and Plan:     Hospital Problems as of 7/Aug 06, 2019ate Reviewed: 7/08-06-19        Codes Class Noted - Resolved POA    * (  Principal) GI bleed ICD-10-CM: K92.2  ICD-9-CM: 578.9  12/03/2017 - Present Yes        A-fib ([place]) ICD-10-CM: I48.91  ICD-9-CM: 427.31  12/03/2017 - Present Unknown        Crohn's disease ([place]) ICD-10-CM: K50.90  ICD-9-CM: 555.9  12/03/2017 - Present Unknown        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 ICD-10-CM: Z45.02  ICD-9-CM: V53.32  12/03/2017 - Present Unknown        Seizure ([place]) ICD-10-CM: R56.9  ICD-9-CM: 780.39  12/03/2017 - Present Unknown         HTN (hypertension) ICD-10-CM: I10  ICD-9-CM: 401.9  12/03/2017 - Present Unknown        S/P AVR ICD-10-CM: Z95.2  ICD-9-CM: V43.3  12/03/2017 - Present Unknown              PLAN:  GI Bleed  -transfuse 1 unit PRBC  -GI consult  -CLD, NPO after midnight  -Hgb & Hct  -CBC and BMP in am  -EGD in am  -PPI    Hx Crohn's disease  -GI consulting    Afib on Eliquis  -hold elquis until cleared by GI  -continue amiodarone    S/P AVR with BLE edema  -'40mg'$  IV lasix  -hold IVF     CAD  -hold ASA   -continue imdur   -Continue statin    Seizure hx  -continue home medcation     Discharge planning:  home  DVT ppx: SCD's  Code status:  Full  Estimated LOS:  Greater than 2 midnights  Risk:  high    Plan of care discussed with Dr. Ray Church,   Signed:  Murvin Natal, NP-C

## 2017-12-03 NOTE — ED Provider Notes (Addendum)
The history is provided by the patient.   Rectal Bleeding    This is a new problem. Episode onset: about 4 days ago. Stool description: Black. Associated symptoms include diarrhea (rather chronic from Crohn's disease). Pertinent negatives include no abdominal pain, no dysuria, no chills, no fever, no nausea, no back pain, no vomiting and no constipation. Risk factors: anticoagulated for atrial fibrillation, history of GI bleed in the past due to AV malformation patient ??2. He has tried nothing for the symptoms. Past medical history comments: atrial fibrillation, Crohn's disease, AV malformation.        Past Medical History:   Diagnosis Date   ??? Arthritis     hips and back    ??? Atrial fibrillation (HCC)     several ablations    ??? Cancer ([place])     melenoma    ??? 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 Colon polyps     cancerous - removed    ??? Crohn disease ([place])    ??? Diabetes ([place])     type 2, adverage glucose 100-120, symtomatic below 70. last A1C ~ 7.1   ??? Hypertension     simce AVR BP is stable    ??? Liver disease     jaundice 1952   ??? Nausea & vomiting     two occasions    ??? PVC (premature ventricular contraction)     one ablation done    ??? S/P AVR    ??? Seizures ([place])     most recent 04-24-16   ??? Sleep apnea     does not have to use C PAP after 20 pound weight loss        Past Surgical History:   Procedure Laterality Date   ??? COLONOSCOPY N/A 06/28/2016    COLONOSCOPY/ 25 / PT HAS ICD performed by Baruch Merl, MD at [facility] ENDOSCOPY   ??? HX AORTIC VALVE REPLACEMENT     ??? HX APPENDECTOMY     ??? HX COLECTOMY      times 4 or 5 - different times    ??? HX COLONOSCOPY     ??? HX HEART CATHETERIZATION      times 2   ??? HX HEMORRHOIDECTOMY     ??? HX HERNIA REPAIR Bilateral    ??? HX SHOULDER ARTHROSCOPY Right    ??? HX VASECTOMY           Family History:   Problem Relation Age of Onset   ??? Cancer Mother    ??? Hypertension Mother    ??? Cancer Father    ??? Diabetes Father    ??? Hypertension Father         Social History     Socioeconomic History   ??? Marital status: MARRIED     Spouse name: Not on file   ??? Number of children: Not on file   ??? Years of education: Not on file   ??? Highest education level: Not on file   Occupational History   ??? Not on file   Social Needs   ??? Financial resource strain: Not on file   ??? Food insecurity:     Worry: Not on file     Inability: Not on file   ??? Transportation needs:     Medical: Not on file     Non-medical: Not on file   Tobacco Use   ??? Smoking status: Former Smoker  Packs/day: 1.00     Years: 13.00     Pack years: 13.00     Last attempt to quit: 07/10/1976     Years since quitting: 41.4   ??? Smokeless tobacco: Never Used   Substance and Sexual Activity   ??? Alcohol use: Yes     Comment: seldom - once a year    ??? Drug use: No   ??? Sexual activity: Not on file   Lifestyle   ??? Physical activity:     Days per week: Not on file     Minutes per session: Not on file   ??? Stress: Not on file   Relationships   ??? Social connections:     Talks on phone: Not on file     Gets together: Not on file     Attends religious service: Not on file     Active member of club or organization: Not on file     Attends meetings of clubs or organizations: Not on file     Relationship status: Not on file   ??? Intimate partner violence:     Fear of current or ex partner: Not on file     Emotionally abused: Not on file     Physically abused: Not on file     Forced sexual activity: Not on file   Other Topics Concern   ??? Not on file   Social History Narrative   ??? Not on file         ALLERGIES: Latex; Codeine; Morphine; Prozac [fluoxetine]; and Tape [adhesive]    Review of Systems   Constitutional: Negative for chills and fever.   HENT: Negative for congestion and rhinorrhea.    Respiratory: Negative for cough and shortness of breath.    Cardiovascular: Negative for chest pain.   Gastrointestinal: Positive for anal bleeding, blood in stool (Black stool  for 4 days and was heme positive today) and diarrhea (rather chronic from Crohn's disease). Negative for abdominal pain, constipation, nausea and vomiting.   Endocrine: Negative for polyuria.   Genitourinary: Negative for dysuria, frequency and hematuria.   Musculoskeletal: Negative for back pain.   Neurological: Positive for light-headedness. Negative for weakness and numbness.       Vitals:    12/03/17 1404   BP: 122/56   Pulse: 72   Resp: 18   Temp: 98.2 ??F (36.8 ??C)   SpO2: 100%   Weight: 79.4 kg (175 lb)   Height: '6\' 1"'$  (1.854 m)            Physical Exam   Constitutional: He is oriented to person, place, and time. He appears well-developed and well-nourished.   HENT:   Mouth/Throat: Oropharynx is clear and moist. No oropharyngeal exudate.   Eyes: Pupils are equal, round, and reactive to light. Conjunctivae are normal.   Neck: Neck supple.   Cardiovascular: Normal rate, regular rhythm and normal heart sounds.   Pulmonary/Chest: Effort normal and breath sounds normal.   Abdominal: Soft. Bowel sounds are normal. He exhibits no distension. There is no tenderness. There is no rebound and no guarding.   Musculoskeletal: Normal range of motion. He exhibits no edema or tenderness.   Lymphadenopathy:     He has no cervical adenopathy.   Neurological: He is alert and oriented to person, place, and time.   Skin: Skin is warm and dry.   Nursing note and vitals reviewed.       MDM  Number of Diagnoses or Management Options  Diagnosis management comments: Vital signs are normal although patient is on a beta blocker, but he does not appear pale and conjunctiva do not appear pale.  Check hemoglobin and discuss with on-call GI physician.  Patient sees Dr. Trixie [facility] with [facility].    3:36 PM  Hemoglobin 9.4 baseline is closer to 11 but has trended down over the last couple of months.  Review of old records at other facilities was required for this. Discussed with GI and patient to be admitted by the hospitalist service.        Amount and/or Complexity of Data Reviewed  Clinical lab tests: ordered and reviewed (Results for orders placed or performed during the hospital encounter of 12/03/17  -CBC WITH AUTOMATED DIFF       Result                      Value             Ref Range           WBC                         6.1               4.3 - 11.1 K*       RBC                         4.00 (L)          4.23 - 5.6 M*       HGB                         9.4 (L)           13.6 - 17.2 *       HCT                         31.3 (L)          41.1 - 50.3 %       MCV                         78.3 (L)          79.6 - 97.8 *       MCH                         23.5 (L)          26.1 - 32.9 *       MCHC                        30.0 (L)          31.4 - 35.0 *       RDW                         16.5 (H)          11.9 - 14.6 %       PLATELET                    187               150 - 450 K/*       MPV  10.3              9.4 - 12.3 FL       ABSOLUTE NRBC               0.00              0.0 - 0.2 K/*       DF                          AUTOMATED                             NEUTROPHILS                 71                43 - 78 %           LYMPHOCYTES                 20                13 - 44 %           MONOCYTES                   6                 4.0 - 12.0 %        EOSINOPHILS                 3                 0.5 - 7.8 %         BASOPHILS                   1                 0.0 - 2.0 %         IMMATURE GRANULOCYTES       0                 0.0 - 5.0 %         ABS. NEUTROPHILS            4.3               1.7 - 8.2 K/*       ABS. LYMPHOCYTES            1.2               0.5 - 4.6 K/*       ABS. MONOCYTES              0.4               0.1 - 1.3 K/*       ABS. EOSINOPHILS            0.2               0.0 - 0.8 K/*       ABS. BASOPHILS              0.1               0.0 - 0.2 K/*       ABS. IMM. GRANS.            0.0  0.0 - 0.5 K/*  -METABOLIC PANEL, COMPREHENSIVE       Result                      Value             Ref Range            Sodium                      141               136 - 145 mm*       Potassium                   3.9               3.5 - 5.1 mm*       Chloride                    104               98 - 107 mmo*       CO2                         30                21 - 32 mmol*       Anion gap                   7                 7 - 16 mmol/L       Glucose                     111 (H)           65 - 100 mg/*       BUN                         17                8 - 23 MG/DL        Creatinine                  1.46              0.8 - 1.5 MG*       GFR est AA                  >60               >60 ml/min/1*       GFR est non-AA              50 (L)            >60 ml/min/1*       Calcium                     8.7               8.3 - 10.4 M*       Bilirubin, total            0.4               0.2 - 1.1 MG*       ALT (SGPT)  29                12 - 65 U/L         AST (SGOT)                  20                15 - 37 U/L         Alk. phosphatase            66                50 - 136 U/L        Protein, total              6.6               6.3 - 8.2 g/*       Albumin                     3.4               3.2 - 4.6 g/*       Globulin                    3.2               2.3 - 3.5 g/*       A-G Ratio                   1.1 (L)           1.2 - 3.5      )  Review and summarize past medical records: yes  Discuss the patient with other providers: yes (Discussed with Dr. Vivi Barrack with the [facility].  Admit to hospitalist.  GI will follow in consult.)           Procedures

## 2017-12-04 HISTORY — PX: ESOPHAGOGASTRODUODENOSCOPY: SHX1529

## 2017-12-04 LAB — HGB & HCT
HCT: 29.5 % — ABNORMAL LOW (ref 41.1–50.3)
HCT: 30.3 % — ABNORMAL LOW (ref 41.1–50.3)
HGB: 8.9 g/dL — ABNORMAL LOW (ref 13.6–17.2)
HGB: 9 g/dL — ABNORMAL LOW (ref 13.6–17.2)

## 2017-12-04 LAB — BNP: BNP: 558 pg/mL — ABNORMAL HIGH

## 2017-12-04 LAB — BRAIN NATRIURETIC PEPTIDE: BNP: 558 pg/mL — ABNORMAL HIGH

## 2017-12-04 LAB — HEMOGLOBIN AND HEMATOCRIT
Hematocrit: 29.5 % — ABNORMAL LOW (ref 41.1–50.3)
Hematocrit: 30.3 % — ABNORMAL LOW (ref 41.1–50.3)
Hemoglobin: 8.9 g/dL — ABNORMAL LOW (ref 13.6–17.2)
Hemoglobin: 9 g/dL — ABNORMAL LOW (ref 13.6–17.2)

## 2017-12-04 MED ORDER — FUROSEMIDE 10 MG/ML IJ SOLN
10 mg/mL | Freq: Once | INTRAMUSCULAR | Status: AC
Start: 2017-12-04 — End: 2017-12-04

## 2017-12-04 MED ORDER — LACTATED RINGERS IV
INTRAVENOUS | Status: DC
Start: 2017-12-04 — End: 2017-12-05
  Administered 2017-12-04: 14:00:00 via INTRAVENOUS

## 2017-12-04 MED ORDER — FERROUS SULFATE 325 MG (65 MG ELEMENTAL IRON) TAB
325 mg (65 mg iron) | Freq: Two times a day (BID) | ORAL | Status: DC
Start: 2017-12-04 — End: 2017-12-04

## 2017-12-04 MED ORDER — INSULIN LISPRO 100 UNIT/ML INJECTION
100 unit/mL | Freq: Three times a day (TID) | SUBCUTANEOUS | Status: DC
Start: 2017-12-04 — End: 2017-12-05

## 2017-12-04 MED ORDER — FERROUS SULFATE 325 MG (65 MG ELEMENTAL IRON) TAB
325 mg (65 mg iron) | Freq: Every day | ORAL | Status: DC
Start: 2017-12-04 — End: 2017-12-05
  Administered 2017-12-05: 10:00:00 via ORAL

## 2017-12-04 MED ORDER — FUROSEMIDE 10 MG/ML IJ SOLN
10 mg/mL | Freq: Once | INTRAMUSCULAR | Status: AC
Start: 2017-12-04 — End: 2017-12-04
  Administered 2017-12-04: 22:00:00 via INTRAVENOUS

## 2017-12-04 MED ORDER — SODIUM CHLORIDE 0.9 % IV
INTRAVENOUS | Status: DC | PRN
Start: 2017-12-04 — End: 2017-12-05

## 2017-12-04 MED ORDER — SODIUM CHLORIDE 0.9 % IJ SYRG
Freq: Three times a day (TID) | INTRAMUSCULAR | Status: DC
Start: 2017-12-04 — End: 2017-12-05
  Administered 2017-12-04 – 2017-12-05 (×3): via INTRAVENOUS

## 2017-12-04 MED ORDER — PROMETHAZINE 25 MG/ML INJECTION
25 mg/mL | Freq: Four times a day (QID) | INTRAMUSCULAR | Status: DC | PRN
Start: 2017-12-04 — End: 2017-12-05

## 2017-12-04 MED ORDER — LIDOCAINE (PF) 20 MG/ML (2 %) IJ SOLN
20 mg/mL (2 %) | INTRAMUSCULAR | Status: DC | PRN
Start: 2017-12-04 — End: 2017-12-04
  Administered 2017-12-04: 15:00:00 via INTRAVENOUS

## 2017-12-04 MED ORDER — DIPHENHYDRAMINE 25 MG CAP
25 mg | Freq: Four times a day (QID) | ORAL | Status: DC | PRN
Start: 2017-12-04 — End: 2017-12-05

## 2017-12-04 MED ORDER — SODIUM CHLORIDE 0.9 % IJ SYRG
INTRAMUSCULAR | Status: DC | PRN
Start: 2017-12-04 — End: 2017-12-05

## 2017-12-04 MED ORDER — PROPOFOL 10 MG/ML IV EMUL
10 mg/mL | INTRAVENOUS | Status: DC | PRN
Start: 2017-12-04 — End: 2017-12-04
  Administered 2017-12-04 (×2): via INTRAVENOUS

## 2017-12-04 MED ORDER — METOPROLOL TARTRATE 25 MG TAB
25 mg | Freq: Two times a day (BID) | ORAL | Status: DC
Start: 2017-12-04 — End: 2017-12-05
  Administered 2017-12-04: 21:00:00 via ORAL

## 2017-12-04 MED FILL — FUROSEMIDE 10 MG/ML IJ SOLN: 10 mg/mL | INTRAMUSCULAR | Qty: 4

## 2017-12-04 MED FILL — PANTOPRAZOLE 40 MG IV SOLR: 40 mg | INTRAVENOUS | Qty: 40

## 2017-12-04 MED FILL — SODIUM CHLORIDE 0.9 % IV: INTRAVENOUS | Qty: 250

## 2017-12-04 MED FILL — VIMPAT 50 MG TABLET: 50 mg | ORAL | Qty: 1

## 2017-12-04 MED FILL — PRAVASTATIN 80 MG TAB: 80 mg | ORAL | Qty: 1

## 2017-12-04 MED FILL — VITAMIN C 500 MG TABLET: 500 mg | ORAL | Qty: 1

## 2017-12-04 MED FILL — XYLOCAINE-MPF 20 MG/ML (2 %) INJECTION SOLUTION: 20 mg/mL (2 %) | INTRAMUSCULAR | Qty: 60

## 2017-12-04 MED FILL — MULTIVITAMIN ORAL LIQUID: ORAL | Qty: 5

## 2017-12-04 MED FILL — POTASSIUM CHLORIDE 20 MEQ ORAL PACKET FOR SOLUTION: 20 mEq | ORAL | Qty: 1

## 2017-12-04 MED FILL — PROPOFOL 10 MG/ML IV EMUL: 10 mg/mL | INTRAVENOUS | Qty: 70

## 2017-12-04 MED FILL — AMIODARONE 200 MG TAB: 200 mg | ORAL | Qty: 2

## 2017-12-04 MED FILL — METOPROLOL TARTRATE 25 MG TAB: 25 mg | ORAL | Qty: 1

## 2017-12-04 MED FILL — LACTATED RINGERS IV: INTRAVENOUS | Qty: 1000

## 2017-12-04 MED FILL — ISOSORBIDE MONONITRATE SR 60 MG 24 HR TAB: 60 mg | ORAL | Qty: 1

## 2017-12-04 NOTE — Progress Notes (Signed)
TRANSFER - IN REPORT:    Verbal report received from Kim(name) on Lance Bradford  being received from 615(unit) for routine progression of care      Report consisted of patient???s Situation, Background, Assessment and   Recommendations(SBAR).     Information from the following report(s) SBAR and Kardex was reviewed with the receiving nurse.    Opportunity for questions and clarification was provided.      Assessment completed upon patient???s arrival to unit and care assumed.

## 2017-12-04 NOTE — Progress Notes (Signed)
...  TRANSFER - IN REPORT:    Verbal report received from Heidi, RN on Lance Bradford  being received from GI lab.    Report consisted of patient???s Situation, Background, Assessment and   Recommendations.    Information from the following report was reviewed with the receiving nurse.    Opportunity for questions and clarification was provided.      Assessment completed upon patient???s arrival to unit and care assumed.

## 2017-12-04 NOTE — Anesthesia Post-Procedure Evaluation (Signed)
Procedure(s):  ESOPHAGOGASTRODUODENOSCOPY (EGD)/BMI 32 TO BE ADMITTED 12/03/17  ESOPHAGOGASTRODUODENAL (EGD) BIOPSY.    total IV anesthesia    Anesthesia Post Evaluation      Multimodal analgesia: multimodal analgesia used between 6 hours prior to anesthesia start to PACU discharge  Patient location during evaluation: PACU  Patient participation: complete - patient participated  Level of consciousness: awake  Pain management: adequate  Airway patency: patent  Anesthetic complications: no  Cardiovascular status: acceptable and hemodynamically stable  Respiratory status: acceptable  Hydration status: acceptable  Comments: Acceptable for discharge from PACU.  Post anesthesia nausea and vomiting:  none      Vitals Value Taken Time   BP 101/49 12/04/2017 11:53 AM   Temp 37 ??C (98.6 ??F) 12/04/2017 11:23 AM   Pulse 69 12/04/2017 11:53 AM   Resp 18 12/04/2017 11:33 AM   SpO2 95 % 12/04/2017 11:53 AM   Vitals shown include unvalidated device data.

## 2017-12-04 NOTE — Anesthesia Pre-Procedure Evaluation (Signed)
Relevant Problems   No relevant active problems       Anesthetic History     PONV          Review of Systems / Medical History  Patient summary reviewed and pertinent labs reviewed    Pulmonary        Sleep apnea (no CPAP since wt loss): No treatment           Neuro/Psych     seizures (most recent 18 mos): well controlled         Cardiovascular    Hypertension: well controlled  Valvular problems/murmurs (s/p AVR 2012)      Dysrhythmias : atrial fibrillation  Pacemaker (AICD in situ - never discharged)    Exercise tolerance: >4 METS  Comments: Cardiac MRI 1/19 - EF 50%, normal functioning AV prosthesis, mild MR   GI/Hepatic/Renal               Comments: H/o Crohn's dz  Mel[facility] Endo/Other    Diabetes: well controlled, type 2    Arthritis and anemia     Other Findings              Physical Exam    Airway  Mallampati: II  TM Distance: > 6 cm  Neck ROM: normal range of motion   Mouth opening: Normal     Cardiovascular    Rhythm: regular  Rate: normal         Dental    Dentition: Full upper dentures     Pulmonary  Breath sounds clear to auscultation               Abdominal         Other Findings            Anesthetic Plan    ASA: 3  Anesthesia type: total IV anesthesia            Anesthetic plan and risks discussed with: Patient

## 2017-12-04 NOTE — Progress Notes (Signed)
..  TRANSFER - OUT REPORT:    Verbal report given to Tracy, RN  on Meldon Carbon  being transferred to GI lab     Report consisted of patient???s Situation, Background, Assessment and   Recommendations.     Information from the following report was reviewed with the receiving nurse.    Lines:   Peripheral IV 12/03/17 Left Antecubital (Active)   Site Assessment Clean, dry, & intact 12/04/2017  3:30 AM   Phlebitis Assessment 0 12/04/2017  3:30 AM   Infiltration Assessment 0 12/04/2017  3:30 AM   Dressing Status Clean, dry, & intact 12/04/2017  3:30 AM   Dressing Type Tape;Transparent 12/04/2017  3:30 AM        Opportunity for questions and clarification was provided.      Patient transported with:

## 2017-12-04 NOTE — Interval H&P Note (Signed)
H&P Update:  Lance DeforestLester Bradford was seen and examined.  History and physical has been reviewed. The patient has been examined. There have been no significant clinical changes since the completion of the originally dated History and Physical.  Patient identified by surgeon; surgical site was confirmed by patient and surgeon.

## 2017-12-04 NOTE — Progress Notes (Signed)
PRBC infusing pt tolerating well

## 2017-12-04 NOTE — Progress Notes (Signed)
Problem: Falls - Risk of  Goal: *Absence of Falls  Description  Document Schmid Fall Risk and appropriate interventions in the flowsheet.  Outcome: Progressing Towards Goal  Note:   Fall Risk Interventions:                                Problem: Patient Education: Go to Patient Education Activity  Goal: Patient/Family Education  Outcome: Progressing Towards Goal

## 2017-12-04 NOTE — Progress Notes (Signed)
All hourly rounds performed. Call light within reach. No complaints at this time. Will continue with plan of care and give report to oncoming nurse.

## 2017-12-04 NOTE — Progress Notes (Signed)
PRBC infusing pt tolerating well

## 2017-12-04 NOTE — Progress Notes (Signed)
...  TRANSFER - IN REPORT:    Verbal report received from [place], RN on Lance Bradford  being received from GI lab.    Report consisted of patient's Situation, Background, Assessment and   Recommendations.    Information from the following report was reviewed with the receiving nurse.    Opportunity for questions and clarification was provided.      Assessment completed upon patient's arrival to unit and care assumed.

## 2017-12-04 NOTE — Progress Notes (Signed)
Chart screened by case manager for discharge planning.  No needs identified at this time.  Please consult case manager if any new issues arise.    Care Management Interventions  PCP Verified by CM: Yes  Transition of Care Consult (CM Consult): Discharge Planning  Current Support Network: Own Home  Confirm Follow Up Transport: Family  Plan discussed with Pt/Family/Caregiver: Yes  Freedom of Choice Offered: Yes  Discharge Location  Discharge Placement: Home

## 2017-12-04 NOTE — Procedures (Signed)
PROCEDURE: Esophagogastroduodenoscopy with biopsy    ENDOSCOPIST: Peterson Lombard, M.D.    PREOPERATIVE DIAGNOSIS: GI bleed    POSTOPERATIVE DIAGNOSIS: Atrophic gastritis, gastric polyps    INSTRUMENTS: [id number]    SEDATION: MAC    DESCRIPTION: After informed consent was obtained, the patient was taken to the endoscopy suite and placed in the left lateral decubitus position. Intravenous sedation was administered as above and posterior pharynx was anesthetized with local anesthetic spray. After adequate sedation had been achieved the endoscope was inserted over the tongue and through the posterior pharynx under direct visualization down the esophagus to the stomach and into the second portion of the duodenum. The endoscopic was withdrawn from that point, performing a careful survey of the mucosa. Retroflexion was performed in the gastric fundus.    FINDINGS:  Esophagus: Normal    Stomach:  Two diminutive polyps of fundus, removed with cold forceps.  Diffuse mucosal atrophy.    Duodenum: Normal    Estimated blood loss:  0 cc-minimal    IMPRESSION: No evidence of bleeding from upper GI tract.  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  Start iron.  Discuss management of Crohn's with patient.      Lance Brenner, MD

## 2017-12-04 NOTE — Progress Notes (Signed)
Blood infusing at 150, this nurse will not increase rate due to pt;s extensive heart history. Pt tolerating well, lungs CTA. Will continue to manage.

## 2017-12-04 NOTE — Anesthesia Pre-Procedure Evaluation (Signed)
Relevant Problems   No relevant active problems       Anesthetic History     PONV          Review of Systems / Medical History  Patient summary reviewed and pertinent labs reviewed    Pulmonary        Sleep apnea (no CPAP since wt loss): No treatment           Neuro/Psych     seizures (most recent 18 mos): well controlled         Cardiovascular    Hypertension: well controlled  Valvular problems/murmurs (s/p AVR 2012)      Dysrhythmias : atrial fibrillation  Pacemaker (AICD in situ - never discharged)    Exercise tolerance: >4 METS  Comments: Cardiac MRI 1/19 - EF 50%, normal functioning AV prosthesis, mild MR   GI/Hepatic/Renal               Comments: H/o Crohn's dz  Mel[facility] Endo/Other    Diabetes: well controlled, type 2    Arthritis and anemia     Other Findings              Physical Exam    Airway  Mallampati: II  TM Distance: > 6 cm  Neck ROM: normal range of motion   Mouth opening: Normal     Cardiovascular    Rhythm: regular  Rate: normal         Dental    Dentition: Full upper dentures     Pulmonary  Breath sounds clear to auscultation               Abdominal         Other Findings            Anesthetic Plan    ASA: 3  Anesthesia type: total IV anesthesia            Anesthetic plan and risks discussed with: Patient

## 2017-12-04 NOTE — Anesthesia Post-Procedure Evaluation (Signed)
Procedure(s):  ESOPHAGOGASTRODUODENOSCOPY (EGD)/BMI 32 TO BE ADMITTED 12/03/17  ESOPHAGOGASTRODUODENAL (EGD) BIOPSY.    total IV anesthesia    Anesthesia Post Evaluation      Multimodal analgesia: multimodal analgesia used between 6 hours prior to anesthesia start to PACU discharge  Patient location during evaluation: PACU  Patient participation: complete - patient participated  Level of consciousness: awake  Pain management: adequate  Airway patency: patent  Anesthetic complications: no  Cardiovascular status: acceptable and hemodynamically stable  Respiratory status: acceptable  Hydration status: acceptable  Comments: Acceptable for discharge from PACU.  Post anesthesia nausea and vomiting:  none      Vitals Value Taken Time   BP 101/49 12/04/2017 11:53 AM   Temp 37 ??C (98.6 ??F) 12/04/2017 11:23 AM   Pulse 69 12/04/2017 11:53 AM   Resp 18 12/04/2017 11:33 AM   SpO2 95 % 12/04/2017 11:53 AM   Vitals shown include unvalidated device data.

## 2017-12-04 NOTE — Progress Notes (Signed)
..  TRANSFER - OUT REPORT:    Verbal report given to Randine, RN  on Lance Bradford  being transferred to GI lab     Report consisted of patient's Situation, Background, Assessment and   Recommendations.     Information from the following report was reviewed with the receiving nurse.    Lines:   Peripheral IV 12/03/17 Left Antecubital (Active)   Site Assessment Clean, dry, & intact 12/04/2017  3:30 AM   Phlebitis Assessment 0 12/04/2017  3:30 AM   Infiltration Assessment 0 12/04/2017  3:30 AM   Dressing Status Clean, dry, & intact 12/04/2017  3:30 AM   Dressing Type Tape;Transparent 12/04/2017  3:30 AM        Opportunity for questions and clarification was provided.      Patient transported with:

## 2017-12-04 NOTE — Progress Notes (Signed)
 TRANSFER - IN REPORT:    Verbal report received from Kim(name) on Lance Bradford  being received from 615(unit) for routine progression of care      Report consisted of patient's Situation, Background, Assessment and   Recommendations(SBAR).     Information from the following report(s) SBAR and Kardex was reviewed with the receiving nurse.    Opportunity for questions and clarification was provided.      Assessment completed upon patient's arrival to unit and care assumed.

## 2017-12-04 NOTE — Progress Notes (Signed)
Progress  Notes by Murvin Natal, NP at 12/04/17 [phone]                Author: Murvin Natal, NP  Service: Internal Medicine  Author Type: Nurse Practitioner       Filed: 12/04/17 0902  Date of Service: 12/04/17 0855  Status: Signed           Editor: Murvin Natal, NP (Nurse Practitioner)  Cosigner: Fleet Contras, MD at 12/04/17 1024                             Hospitalist Progress Note        Admit Date:  12/03/2017  2:07 PM    Name:  Lance Bradford    Age:  77 y.o.   DOB:  1940-08-20    MRN:  [id number]    PCP:  Clarita Leber, MD   Treatment Team: Attending Provider: Otilio Miu, MD; Primary Nurse: Vickey Huger, RN; Primary Nurse: Suszanne Conners, RN; Primary Nurse: Montel Clock; Utilization  Review: Tyson Dense, RN        Subjective:     HPI- Patient is a 77 yo male with significant PMH of crohn's ileitis with resection, afib on eliquis,  aortic valve replacement, multiple ablations, ventricular arrhythmia s/p ICD, HTN, elevated lipids, CAD, AVM's, who came into the ER with reports of melena stools x 5 days with  DOE, dizziness, BLE edema. Denies CP, weakness, cough. Does report lower  abdominal tenderness. Hgb 9.4. GI consulted in ER. Patient scheduled for EGD 7/24. CXR negative. BNP 558. Patient received one dose IV lasix.       Subjective   Patient alert and oriented. Reports two stools overnight, denies melena. Reports stools were grayish brown. Patient reports LUQ tenderness, DOE, tiredness. Denies cough, fever, nausea.    ??           Objective:        Patient Vitals for the past 24 hrs:            Temp  Pulse  Resp  BP  SpO2            12/04/17 0805  98.1 ??F (36.7 ??C)  70  16  112/47  97 %            12/04/17 0628  98.4 ??F (36.9 ??C)  73  18  122/55  96 %     12/03/17 2249  98.3 ??F (36.8 ??C)  70  18  120/53  97 %     12/03/17 1942  97.9 ??F (36.6 ??C)  70  18  121/52  99 %     12/03/17 1717  98.7 ??F (37.1 ??C)  70  18  130/55  100 %     12/03/17 1647  --  70  17  --  97  %     12/03/17 1627  --  --  --  129/63  99 %            12/03/17 1404  98.2 ??F (36.8 ??C)  72  18  122/56  100 %        Oxygen Therapy   O2 Sat (%): 97 % (12/04/17 0805)   Pulse via Oximetry: 71 beats per minute (12/03/17 1647)   O2 Device: Room air (12/03/17 1404)      Intake/Output Summary (Last 24 hours) at 12/04/2017 0855   Last  data filed at 12/04/2017 0823     Gross per 24 hour        Intake  0 ml        Output  --        Net  0 ml             General:    Well nourished.  Alert.  No distress noted.    CV:   RRR.  No murmur, rub, or gallop.   Lungs:   CTAB.  No wheezing, rhonchi, or rales. Room air.    Abdomen:   Soft, LUQ tenderness , nondistended. Bowel sounds hypoactive   Extremities: Warm and dry.  No cyanosis or edema. Edema improved.    Skin:     No rashes or jaundice.       Data Review:   I have reviewed all labs, meds, telemetry events, and studies from the last 24 hours.        Recent Results (from the past 24 hour(s))     CBC WITH AUTOMATED DIFF          Collection Time: 12/03/17  2:08 PM         Result  Value  Ref Range            WBC  6.1  4.3 - 11.1 K/uL       RBC  4.00 (L)  4.23 - 5.6 M/uL       HGB  9.4 (L)  13.6 - 17.2 g/dL       HCT  31.3 (L)  41.1 - 50.3 %       MCV  78.3 (L)  79.6 - 97.8 FL       MCH  23.5 (L)  26.1 - 32.9 PG       MCHC  30.0 (L)  31.4 - 35.0 g/dL       RDW  16.5 (H)  11.9 - 14.6 %       PLATELET  187  150 - 450 K/uL       MPV  10.3  9.4 - 12.3 FL       ABSOLUTE NRBC  0.00  0.0 - 0.2 K/uL       DF  AUTOMATED          NEUTROPHILS  71  43 - 78 %       LYMPHOCYTES  20  13 - 44 %       MONOCYTES  6  4.0 - 12.0 %       EOSINOPHILS  3  0.5 - 7.8 %       BASOPHILS  1  0.0 - 2.0 %       IMMATURE GRANULOCYTES  0  0.0 - 5.0 %       ABS. NEUTROPHILS  4.3  1.7 - 8.2 K/UL       ABS. LYMPHOCYTES  1.2  0.5 - 4.6 K/UL       ABS. MONOCYTES  0.4  0.1 - 1.3 K/UL       ABS. EOSINOPHILS  0.2  0.0 - 0.8 K/UL       ABS. BASOPHILS  0.1  0.0 - 0.2 K/UL       ABS. IMM. GRANS.  0.0  0.0 - 0.5 K/UL        METABOLIC PANEL, COMPREHENSIVE          Collection Time: 12/03/17  2:08 PM         Result  Value  Ref Range            Sodium  141  136 - 145 mmol/L       Potassium  3.9  3.5 - 5.1 mmol/L       Chloride  104  98 - 107 mmol/L       CO2  30  21 - 32 mmol/L       Anion gap  7  7 - 16 mmol/L       Glucose  111 (H)  65 - 100 mg/dL       BUN  17  8 - 23 MG/DL       Creatinine  1.46  0.8 - 1.5 MG/DL       GFR est AA  >60  >60 ml/min/1.31m       GFR est non-AA  50 (L)  >60 ml/min/1.775m      Calcium  8.7  8.3 - 10.4 MG/DL       Bilirubin, total  0.4  0.2 - 1.1 MG/DL       ALT (SGPT)  29  12 - 65 U/L       AST (SGOT)  20  15 - 37 U/L       Alk. phosphatase  66  50 - 136 U/L       Protein, total  6.6  6.3 - 8.2 g/dL       Albumin  3.4  3.2 - 4.6 g/dL       Globulin  3.2  2.3 - 3.5 g/dL       A-G Ratio  1.1 (L)  1.2 - 3.5         TYPE & SCREEN          Collection Time: 12/03/17  4:25 PM         Result  Value  Ref Range            Crossmatch Expiration  12/06/2017         ABO/Rh(D)  O Jenetta DownerOSITIVE         Antibody screen  NEG         EKG, 12 LEAD, INITIAL          Collection Time: 12/03/17  4:25 PM         Result  Value  Ref Range            Ventricular Rate  69  BPM       Atrial Rate  170  BPM       QRS Duration  220  ms       Q-T Interval  504  ms       QTC Calculation (Bezet)  540  ms       Calculated R Axis  -79  degrees       Calculated T Axis  91  degrees       Diagnosis                 AV sequential or dual chamber electronic pacemaker   Abnormal ECG   No previous ECGs available   Confirmed by NEPenn High[facility] DuboisMD ([facility]), MATTHEW G ([id number] 12/03/2017 5:09:02 PM          IRON          Collection Time: 12/03/17  4:41 PM         Result  Value  Ref Range  Iron  18 (L)  35 - 150 ug/dL       TRANSFERRIN SATURATION          Collection Time: 12/03/17  4:41 PM         Result  Value  Ref Range            Iron  15 (L)  35 - 150 ug/dL       TIBC  468 (H)  250 - 450 ug/dL       Transferrin Saturation  3 (L)  >20 %       FERRITIN           Collection Time: 12/03/17  4:41 PM         Result  Value  Ref Range            Ferritin  12  8 - 388 NG/ML       HGB & HCT          Collection Time: 12/03/17  4:41 PM         Result  Value  Ref Range            HGB  9.5 (L)  13.6 - 17.2 g/dL       HCT  31.5 (L)  41.1 - 50.3 %       BNP          Collection Time: 12/03/17  7:04 PM         Result  Value  Ref Range            BNP  558 (H)  0 pg/mL       HGB & HCT          Collection Time: 12/04/17  5:47 AM         Result  Value  Ref Range            HGB  8.9 (L)  13.6 - 17.2 g/dL            HCT  29.5 (L)  41.1 - 50.3 %              All Micro Results           None                  Current Meds:     Current Facility-Administered Medications          Medication  Dose  Route  Frequency           ?  0.9% sodium chloride infusion 250 mL   250 mL  IntraVENous  PRN     ?  pantoprazole (PROTONIX) 40 mg in sodium chloride 0.9% 10 mL injection   40 mg  IntraVENous  Q12H     ?  ascorbic acid (vitamin C) (VITAMIN C) tablet 500 mg   500 mg  Oral  DAILY     ?  multivitamin (MULTI-DELYN, WELLESSE) oral liquid 5 mL   5 mL  Oral  DAILY     ?  potassium chloride (KLOR-CON) packet for solution 20 mEq   20 mEq  Oral  DAILY     ?  amiodarone (CORDARONE) tablet 400 mg   400 mg  Oral  DAILY     ?  pravastatin (PRAVACHOL) tablet 80 mg   80 mg  Oral  QHS     ?  isosorbide mononitrate ER (IMDUR) tablet 60 mg   60 mg  Oral  DAILY           ?  lacosamide (VIMPAT) tablet 150 mg   150 mg  Oral  BID           Other Studies (last 24 hours):   Xr Chest Sngl V      Result Date: 12/03/2017   Portable chest x-ray CLINICAL INDICATION: Rhonchi on exam FINDINGS: Single AP view the chest submitted without comparison show the lungs to be expanded and clear. No pleural effusion or pneumothorax. 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 A left-sided pacer device  in place.       IMPRESSION: No acute cardiopulmonary abnormality.           Assessment and Plan:           Hospital Problems as of  12/04/2017  Date Reviewed:   12/03/2017                         Codes  Class  Noted - Resolved  POA              * (Principal) GI bleed  ICD-10-CM: K92.2   ICD-9-CM: 578.9    12/03/2017 - Present  Yes                        A-fib ([place])  ICD-10-CM: I48.91   ICD-9-CM: 427.31    12/03/2017 - Present  Unknown                        Crohn's disease ([place])  ICD-10-CM: K50.90   ICD-9-CM: 555.9    12/03/2017 - Present  Unknown                        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  ICD-10-CM: Z45.02   ICD-9-CM: V53.32    12/03/2017 - Present  Unknown                        Seizure ([place])  ICD-10-CM: R56.9   ICD-9-CM: 780.39    12/03/2017 - Present  Unknown                        HTN (hypertension)  ICD-10-CM: I10   ICD-9-CM: 401.9    12/03/2017 - Present  Unknown                        S/P AVR  ICD-10-CM: Z95.2   ICD-9-CM: V43.3    12/03/2017 - Present  Unknown                          PLAN:     GI Bleed   -transfuse 1 unit PRBC, did not receive. Patient symptomatic. Will reorder.    -GI consult   -CLD, NPO after midnight   -Hgb & Hct (8.9/29.5 this am)   -CBC and BMP in am   -EGD today    -PPI   ??   Hx Crohn's disease   -GI consulting   ??   Afib on Eliquis   -hold elquis until cleared by GI   -continue amiodarone   ??   S/P AVR with BLE edema- improved   -30m IV lasix x 1 dose   -hold IVF    ??   CAD   -hold ASA    -  continue imdur    -Continue statin   ??   Seizure hx   -continue home medcation    ??   Discharge planning:  home   DVT ppx: SCD's   Code status:  Full   Estimated LOS:  Greater than 2 midnights   Risk:  high         DC planning/Dispo:  home   DVT ppx:  SCD's       Plan of care discussed with Dr. Quillian Quince      Signed:   Murvin Natal, NP

## 2017-12-04 NOTE — Progress Notes (Signed)
 TRANSFER - OUT REPORT:    Verbal report given to Luke, RN on Lance Bradford  being transferred to Room 615 for routine post - op       Report consisted of patient's Situation, Background, Assessment and   Recommendations(SBAR).     Information from the following report(s) SBAR and Procedure Summary was reviewed with the receiving nurse.    Lines:   Peripheral IV 12/03/17 Left Antecubital (Active)   Site Assessment Clean, dry, & intact 12/04/2017  8:05 AM   Phlebitis Assessment 0 12/04/2017  8:05 AM   Infiltration Assessment 0 12/04/2017  8:05 AM   Dressing Status Clean, dry, & intact 12/04/2017  8:05 AM   Dressing Type Tape;Transparent 12/04/2017  8:05 AM        Opportunity for questions and clarification was provided.

## 2017-12-04 NOTE — Other (Signed)
Pt transported to room 615 via stretcher on room air by transportation, wife accompanied.

## 2017-12-04 NOTE — Progress Notes (Signed)
Blood infusing at 150, this nurse will not increase rate due to pt;s extensive heart history. Pt tolerating well, lungs CTA. Will continue to manage.

## 2017-12-04 NOTE — Procedures (Signed)
PROCEDURE: Esophagogastroduodenoscopy with biopsy    ENDOSCOPIST: Cheyrl Buley Isaac Dawanda Mapel, M.D.    PREOPERATIVE DIAGNOSIS: GI bleed    POSTOPERATIVE DIAGNOSIS: Atrophic gastritis, gastric polyps    INSTRUMENTS: [id number]    SEDATION: MAC    DESCRIPTION: After informed consent was obtained, the patient was taken to the endoscopy suite and placed in the left lateral decubitus position. Intravenous sedation was administered as above and posterior pharynx was anesthetized with local anesthetic spray. After adequate sedation had been achieved the endoscope was inserted over the tongue and through the posterior pharynx under direct visualization down the esophagus to the stomach and into the second portion of the duodenum. The endoscopic was withdrawn from that point, performing a careful survey of the mucosa. Retroflexion was performed in the gastric fundus.    FINDINGS:  Esophagus: Normal    Stomach:  Two diminutive polyps of fundus, removed with cold forceps.  Diffuse mucosal atrophy.    Duodenum: Normal    Estimated blood loss:  0 cc-minimal    IMPRESSION: No evidence of bleeding from upper GI tract.  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  Start iron.  Discuss management of Crohn's with patient.      D.Isaac Adrien Dietzman, MD

## 2017-12-04 NOTE — Progress Notes (Signed)
TRANSFER - OUT REPORT:    Verbal report given to Kim, RN on Lance Bradford  being transferred to Room 615 for routine post - op       Report consisted of patient???s Situation, Background, Assessment and   Recommendations(SBAR).     Information from the following report(s) SBAR and Procedure Summary was reviewed with the receiving nurse.    Lines:   Peripheral IV 12/03/17 Left Antecubital (Active)   Site Assessment Clean, dry, & intact 12/04/2017  8:05 AM   Phlebitis Assessment 0 12/04/2017  8:05 AM   Infiltration Assessment 0 12/04/2017  8:05 AM   Dressing Status Clean, dry, & intact 12/04/2017  8:05 AM   Dressing Type Tape;Transparent 12/04/2017  8:05 AM        Opportunity for questions and clarification was provided.

## 2017-12-04 NOTE — Progress Notes (Signed)
Hospitalist Progress Note     Admit Date:  12/03/2017  2:07 PM   Name:  Lance Bradford   Age:  77 y.o.  DOB:  02/22/1941   MRN:  [id number]   PCP:  Clarita Leber, MD  Treatment Team: Attending Provider: Otilio Miu, MD; Primary Nurse: Vickey Huger, RN; Primary Nurse: Suszanne Conners, RN; Primary Nurse: Montel Clock; Utilization Review: Tyson Dense, RN    Subjective:   HPI- Patient is a 77 yo male with significant PMH of crohn's ileitis with resection, afib on eliquis, aortic valve replacement, multiple ablations, ventricular arrhythmia s/p ICD, HTN, elevated lipids, CAD, AVM's, who came into the ER with reports of melena stools x 5 days with  DOE, dizziness, BLE edema. Denies CP, weakness, cough. Does report lower abdominal tenderness. Hgb 9.4. GI consulted in ER. Patient scheduled for EGD 7/24. CXR negative. BNP 558. Patient received one dose IV lasix.     Subjective  Patient alert and oriented. Reports two stools overnight, denies melena. Reports stools were grayish brown. Patient reports LUQ tenderness, DOE, tiredness. Denies cough, fever, nausea.   ??      Objective:     Patient Vitals for the past 24 hrs:   Temp Pulse Resp BP SpO2   12/04/17 0805 98.1 ??F (36.7 ??C) 70 16 112/47 97 %   12/04/17 0628 98.4 ??F (36.9 ??C) 73 18 122/55 96 %   12/03/17 2249 98.3 ??F (36.8 ??C) 70 18 120/53 97 %   12/03/17 1942 97.9 ??F (36.6 ??C) 70 18 121/52 99 %   12/03/17 1717 98.7 ??F (37.1 ??C) 70 18 130/55 100 %   12/03/17 1647 ??? 70 17 ??? 97 %   12/03/17 1627 ??? ??? ??? 129/63 99 %   12/03/17 1404 98.2 ??F (36.8 ??C) 72 18 122/56 100 %     Oxygen Therapy  O2 Sat (%): 97 % (12/04/17 0805)  Pulse via Oximetry: 71 beats per minute (12/03/17 1647)  O2 Device: Room air (12/03/17 1404)    Intake/Output Summary (Last 24 hours) at 12/04/2017 0855  Last data filed at 12/04/2017 0823  Gross per 24 hour   Intake 0 ml   Output ???   Net 0 ml         General:    Well nourished.  Alert.  No distress noted.    CV:   RRR.  No murmur, rub, or gallop.  Lungs:   CTAB.  No wheezing, rhonchi, or rales. Room air.   Abdomen:   Soft, LUQ tenderness , nondistended. Bowel sounds hypoactive  Extremities: Warm and dry.  No cyanosis or edema. Edema improved.   Skin:     No rashes or jaundice.     Data Review:  I have reviewed all labs, meds, telemetry events, and studies from the last 24 hours.    Recent Results (from the past 24 hour(s))   CBC WITH AUTOMATED DIFF    Collection Time: 12/03/17  2:08 PM   Result Value Ref Range    WBC 6.1 4.3 - 11.1 K/uL    RBC 4.00 (L) 4.23 - 5.6 M/uL    HGB 9.4 (L) 13.6 - 17.2 g/dL    HCT 31.3 (L) 41.1 - 50.3 %    MCV 78.3 (L) 79.6 - 97.8 FL    MCH 23.5 (L) 26.1 - 32.9 PG    MCHC 30.0 (L) 31.4 - 35.0 g/dL    RDW 16.5 (H) 11.9 - 14.6 %  PLATELET 187 150 - 450 K/uL    MPV 10.3 9.4 - 12.3 FL    ABSOLUTE NRBC 0.00 0.0 - 0.2 K/uL    DF AUTOMATED      NEUTROPHILS 71 43 - 78 %    LYMPHOCYTES 20 13 - 44 %    MONOCYTES 6 4.0 - 12.0 %    EOSINOPHILS 3 0.5 - 7.8 %    BASOPHILS 1 0.0 - 2.0 %    IMMATURE GRANULOCYTES 0 0.0 - 5.0 %    ABS. NEUTROPHILS 4.3 1.7 - 8.2 K/UL    ABS. LYMPHOCYTES 1.2 0.5 - 4.6 K/UL    ABS. MONOCYTES 0.4 0.1 - 1.3 K/UL    ABS. EOSINOPHILS 0.2 0.0 - 0.8 K/UL    ABS. BASOPHILS 0.1 0.0 - 0.2 K/UL    ABS. IMM. GRANS. 0.0 0.0 - 0.5 K/UL   METABOLIC PANEL, COMPREHENSIVE    Collection Time: 12/03/17  2:08 PM   Result Value Ref Range    Sodium 141 136 - 145 mmol/L    Potassium 3.9 3.5 - 5.1 mmol/L    Chloride 104 98 - 107 mmol/L    CO2 30 21 - 32 mmol/L    Anion gap 7 7 - 16 mmol/L    Glucose 111 (H) 65 - 100 mg/dL    BUN 17 8 - 23 MG/DL    Creatinine 1.46 0.8 - 1.5 MG/DL    GFR est AA >60 >60 ml/min/1.18m    GFR est non-AA 50 (L) >60 ml/min/1.721m   Calcium 8.7 8.3 - 10.4 MG/DL    Bilirubin, total 0.4 0.2 - 1.1 MG/DL    ALT (SGPT) 29 12 - 65 U/L    AST (SGOT) 20 15 - 37 U/L    Alk. phosphatase 66 50 - 136 U/L    Protein, total 6.6 6.3 - 8.2 g/dL    Albumin 3.4 3.2 - 4.6 g/dL     Globulin 3.2 2.3 - 3.5 g/dL    A-G Ratio 1.1 (L) 1.2 - 3.5     TYPE & SCREEN    Collection Time: 12/03/17  4:25 PM   Result Value Ref Range    Crossmatch Expiration 12/06/2017     ABO/Rh(D) O Jenetta DownerOSITIVE     Antibody screen NEG    EKG, 12 LEAD, INITIAL    Collection Time: 12/03/17  4:25 PM   Result Value Ref Range    Ventricular Rate 69 BPM    Atrial Rate 170 BPM    QRS Duration 220 ms    Q-T Interval 504 ms    QTC Calculation (Bezet) 540 ms    Calculated R Axis -79 degrees    Calculated T Axis 91 degrees    Diagnosis       AV sequential or dual chamber electronic pacemaker  Abnormal ECG  No previous ECGs available  Confirmed by [facility] ([facility]), MATTHEW G ([id number]) on 12/03/2017 5:09:02 PM     IRON    Collection Time: 12/03/17  4:41 PM   Result Value Ref Range    Iron 18 (L) 35 - 150 ug/dL   TRANSFERRIN SATURATION    Collection Time: 12/03/17  4:41 PM   Result Value Ref Range    Iron 15 (L) 35 - 150 ug/dL    TIBC 468 (H) 250 - 450 ug/dL    Transferrin Saturation 3 (L) >20 %   FERRITIN    Collection Time: 12/03/17  4:41 PM   Result Value Ref Range  Ferritin 12 8 - 388 NG/ML   HGB & HCT    Collection Time: 19-Dec-2017  4:41 PM   Result Value Ref Range    HGB 9.5 (L) 13.6 - 17.2 g/dL    HCT 31.5 (L) 41.1 - 50.3 %   BNP    Collection Time: 12/19/17  7:04 PM   Result Value Ref Range    BNP 558 (H) 0 pg/mL   HGB & HCT    Collection Time: 12/04/17  5:47 AM   Result Value Ref Range    HGB 8.9 (L) 13.6 - 17.2 g/dL    HCT 29.5 (L) 41.1 - 50.3 %        All Micro Results     None          Current Meds:  Current Facility-Administered Medications   Medication Dose Route Frequency   ??? 0.9% sodium chloride infusion 250 mL  250 mL IntraVENous PRN   ??? pantoprazole (PROTONIX) 40 mg in sodium chloride 0.9% 10 mL injection  40 mg IntraVENous Q12H   ??? ascorbic acid (vitamin C) (VITAMIN C) tablet 500 mg  500 mg Oral DAILY   ??? multivitamin (MULTI-DELYN, WELLESSE) oral liquid 5 mL  5 mL Oral DAILY    ??? potassium chloride (KLOR-CON) packet for solution 20 mEq  20 mEq Oral DAILY   ??? amiodarone (CORDARONE) tablet 400 mg  400 mg Oral DAILY   ??? pravastatin (PRAVACHOL) tablet 80 mg  80 mg Oral QHS   ??? isosorbide mononitrate ER (IMDUR) tablet 60 mg  60 mg Oral DAILY   ??? lacosamide (VIMPAT) tablet 150 mg  150 mg Oral BID       Other Studies (last 24 hours):  Xr Chest Sngl V    Result Date: 12/19/2017  Portable chest x-ray CLINICAL INDICATION: Rhonchi on exam FINDINGS: Single AP view the chest submitted without comparison show the lungs to be expanded and clear. No pleural effusion or pneumothorax. 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 A left-sided pacer device in place.     IMPRESSION: No acute cardiopulmonary abnormality.      Assessment and Plan:     Hospital Problems as of 12/04/2017 Date Reviewed: 2017/12/19          Codes Class Noted - Resolved POA    * (Principal) GI bleed ICD-10-CM: K92.2  ICD-9-CM: 578.9  12/19/2017 - Present Yes        A-fib ([place]) ICD-10-CM: I48.91  ICD-9-CM: 427.31  2017-12-19 - Present Unknown        Crohn's disease ([place]) ICD-10-CM: K50.90  ICD-9-CM: 555.9  Dec 19, 2017 - Present Unknown        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 ICD-10-CM: Z45.02  ICD-9-CM: V53.32  2017-12-19 - Present Unknown        Seizure ([place]) ICD-10-CM: R56.9  ICD-9-CM: 780.39  12-19-2017 - Present Unknown        HTN (hypertension) ICD-10-CM: I10  ICD-9-CM: 401.9  19-Dec-2017 - Present Unknown        S/P AVR ICD-10-CM: Z95.2  ICD-9-CM: V43.3  12/19/2017 - Present Unknown              PLAN:    GI Bleed  -transfuse 1 unit PRBC, did not receive. Patient symptomatic. Will reorder.   -GI consult  -CLD, NPO after midnight  -Hgb & Hct (8.9/29.5 this am)  -CBC and BMP in am  -EGD today   -PPI  ??  Hx Crohn's disease  -GI consulting  ??  Afib on  Eliquis  -hold elquis until cleared by GI  -continue amiodarone  ??  S/P AVR with BLE edema- improved  -'40mg'$  IV lasix x 1 dose  -hold IVF   ??  CAD  -hold ASA   -continue imdur    -Continue statin  ??  Seizure hx  -continue home medcation   ??  Discharge planning:  home  DVT ppx: SCD's  Code status:  Full  Estimated LOS:  Greater than 2 midnights  Risk:  high      DC planning/Dispo:  home  DVT ppx:  SCD's     Plan of care discussed with Dr. Quillian Quince    Signed:  Murvin Natal, NP

## 2017-12-05 LAB — CBC WITH AUTOMATED DIFF
ABS. BASOPHILS: 0.1 10*3/uL (ref 0.0–0.2)
ABS. EOSINOPHILS: 0.4 10*3/uL (ref 0.0–0.8)
ABS. IMM. GRANS.: 0 10*3/uL (ref 0.0–0.5)
ABS. LYMPHOCYTES: 1.2 10*3/uL (ref 0.5–4.6)
ABS. MONOCYTES: 0.5 10*3/uL (ref 0.1–1.3)
ABS. NEUTROPHILS: 5.6 10*3/uL (ref 1.7–8.2)
ABSOLUTE NRBC: 0 10*3/uL (ref 0.0–0.2)
BASOPHILS: 1 % (ref 0.0–2.0)
EOSINOPHILS: 5 % (ref 0.5–7.8)
HCT: 33.4 % — ABNORMAL LOW (ref 41.1–50.3)
HGB: 10.2 g/dL — ABNORMAL LOW (ref 13.6–17.2)
IMMATURE GRANULOCYTES: 0 % (ref 0.0–5.0)
LYMPHOCYTES: 15 % (ref 13–44)
MCH: 24.5 PG — ABNORMAL LOW (ref 26.1–32.9)
MCHC: 30.5 g/dL — ABNORMAL LOW (ref 31.4–35.0)
MCV: 80.1 FL (ref 79.6–97.8)
MONOCYTES: 7 % (ref 4.0–12.0)
MPV: 10.4 FL (ref 9.4–12.3)
NEUTROPHILS: 72 % (ref 43–78)
PLATELET: 176 10*3/uL (ref 150–450)
RBC: 4.17 M/uL — ABNORMAL LOW (ref 4.23–5.6)
RDW: 16.9 % — ABNORMAL HIGH (ref 11.9–14.6)
WBC: 7.8 10*3/uL (ref 4.3–11.1)

## 2017-12-05 LAB — HGB & HCT
HCT: 32.3 % — ABNORMAL LOW (ref 41.1–50.3)
HGB: 9.9 g/dL — ABNORMAL LOW (ref 13.6–17.2)

## 2017-12-05 LAB — METABOLIC PANEL, COMPREHENSIVE
A-G Ratio: 1.1 — ABNORMAL LOW (ref 1.2–3.5)
ALT (SGPT): 19 U/L (ref 12–65)
AST (SGOT): 19 U/L (ref 15–37)
Albumin: 3.1 g/dL — ABNORMAL LOW (ref 3.2–4.6)
Alk. phosphatase: 63 U/L (ref 50–136)
Anion gap: 6 mmol/L — ABNORMAL LOW (ref 7–16)
BUN: 16 MG/DL (ref 8–23)
Bilirubin, total: 0.6 MG/DL (ref 0.2–1.1)
CO2: 32 mmol/L (ref 21–32)
Calcium: 8.4 MG/DL (ref 8.3–10.4)
Chloride: 106 mmol/L (ref 98–107)
Creatinine: 1.35 MG/DL (ref 0.8–1.5)
GFR est AA: >60 mL/min/{1.73_m2} (ref >60)
GFR est non-AA: 55 mL/min/{1.73_m2} — ABNORMAL LOW (ref >60)
Globulin: 2.9 g/dL (ref 2.3–3.5)
Glucose: 98 mg/dL (ref 65–100)
Potassium: 4.1 mmol/L (ref 3.5–5.1)
Protein, total: 6 g/dL — ABNORMAL LOW (ref 6.3–8.2)
Sodium: 144 mmol/L (ref 136–145)

## 2017-12-05 LAB — GLUCOSE, POC
Glucose (POC): 147 mg/dL — ABNORMAL HIGH (ref 65–100)
Glucose (POC): 104 mg/dL — ABNORMAL HIGH (ref 65–100)

## 2017-12-05 LAB — HEP B SURFACE AB: Hepatitis B surface Ab: <3.1 m[IU]/mL

## 2017-12-05 LAB — COMPREHENSIVE METABOLIC PANEL
ALT: 19 U/L (ref 12–65)
AST: 19 U/L (ref 15–37)
Albumin/Globulin Ratio: 1.1 — ABNORMAL LOW (ref 1.2–3.5)
Albumin: 3.1 g/dL — ABNORMAL LOW (ref 3.2–4.6)
Alkaline Phosphatase: 63 U/L (ref 50–136)
Anion Gap: 6 mmol/L — ABNORMAL LOW (ref 7–16)
BUN: 16 MG/DL (ref 8–23)
CO2: 32 mmol/L (ref 21–32)
Calcium: 8.4 MG/DL (ref 8.3–10.4)
Chloride: 106 mmol/L (ref 98–107)
Creatinine: 1.35 MG/DL (ref 0.8–1.5)
EGFR IF NonAfrican American: 55 mL/min/{1.73_m2} — ABNORMAL LOW (ref >60)
GFR African American: >60 mL/min/{1.73_m2} (ref >60)
Globulin: 2.9 g/dL (ref 2.3–3.5)
Glucose: 98 mg/dL (ref 65–100)
Potassium: 4.1 mmol/L (ref 3.5–5.1)
Sodium: 144 mmol/L (ref 136–145)
Total Bilirubin: 0.6 MG/DL (ref 0.2–1.1)
Total Protein: 6 g/dL — ABNORMAL LOW (ref 6.3–8.2)

## 2017-12-05 LAB — HEMOGLOBIN AND HEMATOCRIT
Hematocrit: 32.3 % — ABNORMAL LOW (ref 41.1–50.3)
Hemoglobin: 9.9 g/dL — ABNORMAL LOW (ref 13.6–17.2)

## 2017-12-05 LAB — CBC WITH AUTO DIFFERENTIAL
Basophils %: 1 % (ref 0.0–2.0)
Basophils Absolute: 0.1 10*3/uL (ref 0.0–0.2)
Eosinophils %: 5 % (ref 0.5–7.8)
Eosinophils Absolute: 0.4 10*3/uL (ref 0.0–0.8)
Granulocyte Absolute Count: 0 10*3/uL (ref 0.0–0.5)
Hematocrit: 33.4 % — ABNORMAL LOW (ref 41.1–50.3)
Hemoglobin: 10.2 g/dL — ABNORMAL LOW (ref 13.6–17.2)
Immature Granulocytes: 0 % (ref 0.0–5.0)
Lymphocytes %: 15 % (ref 13–44)
Lymphocytes Absolute: 1.2 10*3/uL (ref 0.5–4.6)
MCH: 24.5 PG — ABNORMAL LOW (ref 26.1–32.9)
MCHC: 30.5 g/dL — ABNORMAL LOW (ref 31.4–35.0)
MCV: 80.1 FL (ref 79.6–97.8)
MPV: 10.4 FL (ref 9.4–12.3)
Monocytes %: 7 % (ref 4.0–12.0)
Monocytes Absolute: 0.5 10*3/uL (ref 0.1–1.3)
NRBC Absolute: 0 10*3/uL (ref 0.0–0.2)
Neutrophils %: 72 % (ref 43–78)
Neutrophils Absolute: 5.6 10*3/uL (ref 1.7–8.2)
Platelets: 176 10*3/uL (ref 150–450)
RBC: 4.17 M/uL — ABNORMAL LOW (ref 4.23–5.6)
RDW: 16.9 % — ABNORMAL HIGH (ref 11.9–14.6)
WBC: 7.8 10*3/uL (ref 4.3–11.1)

## 2017-12-05 LAB — POCT GLUCOSE
POC Glucose: 147 mg/dL — ABNORMAL HIGH (ref 65–100)
POC Glucose: 104 mg/dL — ABNORMAL HIGH (ref 65–100)

## 2017-12-05 LAB — HEPATITIS B SURFACE ANTIBODY: Hep B S Ab: <3.1 m[IU]/mL

## 2017-12-05 MED ORDER — FERROUS SULFATE 325 MG (65 MG ELEMENTAL IRON) TAB
325 mg (65 mg iron) | ORAL_TABLET | Freq: Every day | ORAL | 0 refills | Status: AC
Start: 2017-12-05 — End: 2018-01-05

## 2017-12-05 MED ORDER — TRIAMCINOLONE ACETONIDE 0.1 % TOPICAL CREAM
0.1 % | Freq: Two times a day (BID) | CUTANEOUS | 0 refills | Status: AC
Start: 2017-12-05 — End: ?

## 2017-12-05 MED FILL — PRAVASTATIN 80 MG TAB: 80 mg | ORAL | Qty: 1

## 2017-12-05 MED FILL — POTASSIUM CHLORIDE 20 MEQ ORAL PACKET FOR SOLUTION: 20 mEq | ORAL | Qty: 1

## 2017-12-05 MED FILL — VIMPAT 50 MG TABLET: 50 mg | ORAL | Qty: 1

## 2017-12-05 MED FILL — METOPROLOL TARTRATE 25 MG TAB: 25 mg | ORAL | Qty: 1

## 2017-12-05 MED FILL — FERROUS SULFATE 325 MG (65 MG ELEMENTAL IRON) TAB: 325 mg (65 mg iron) | ORAL | Qty: 1

## 2017-12-05 MED FILL — ISOSORBIDE MONONITRATE SR 60 MG 24 HR TAB: 60 mg | ORAL | Qty: 1

## 2017-12-05 MED FILL — AMIODARONE 200 MG TAB: 200 mg | ORAL | Qty: 2

## 2017-12-05 MED FILL — MULTIVITAMIN ORAL LIQUID: ORAL | Qty: 5

## 2017-12-05 MED FILL — VITAMIN C 500 MG TABLET: 500 mg | ORAL | Qty: 1

## 2017-12-05 NOTE — Other (Deleted)
Pt admitted with  GI bleed and has anemia documented. Please further specify type and acuity of anemia in the medical record.     ? Anemia due to acute blood loss  ? Anemia due to chronic blood loss  ? Anemia due to chronic disease, please specify disease  ? Dilutional anemia  ? Other, please specify  ? Clinically unable to determine    The medical record reflects the following:     Risk Factors: age, crohn's ileitis with resection, A Fib, HTN, CAD, melena     Clinical Indicators: H&H-9.4/31.3 on 7/23, H&H-8.9/29.5 on 7/24, H&H-10.2/33.4 on 7/25 after 1 unit PRBCs      Treatment: 1 units PRBCs, H&H Q6 starting on 7/23 @ 1615 until specified     Thanks,  S. Niel HummerElizabeth Collier, BSN, RN, CDS  [facility] [employer] [facility]  [phone]

## 2017-12-05 NOTE — Progress Notes (Signed)
Prescription for ferrous sulfate called in to [facility] pharmacy in [place], pt aware.

## 2017-12-05 NOTE — Progress Notes (Addendum)
[facility] Progress Note         Admit Date:  12/03/2017    Today's Date:  12/05/2017    CC:  GI bleed    Subjective:     Patient underwent EGD with Dr Floyce Stakes 12/04/17. There was no evidence of bleeding. 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 Hep B Ab/Ag and Quantiferon Gold ordered and pending. Plans for starting pt on Entyvio as outpatient. HGB 9.0 yesterday, he complained of DOE and was transfused 1 unit of PRBC overnight for symptomatic anemia. HGB 10.2 today. No BM since yesterday early evening. He denies any blood in that BM. He continues to have some lower abdominal pain, rating 3/10 currently with some cramping. No upper GI symptoms. He is tolerating diet well.     Medications:   Current Facility-Administered Medications   Medication Dose Route Frequency   ??? ferrous sulfate tablet 325 mg  325 mg Oral ACB   ??? metoprolol tartrate (LOPRESSOR) tablet 25 mg  25 mg Oral BID   ??? sodium chloride (NS) flush 5-40 mL  5-40 mL IntraVENous Q8H   ??? sodium chloride (NS) flush 5-40 mL  5-40 mL IntraVENous PRN   ??? promethazine (PHENERGAN) with saline injection 12.5 mg  12.5 mg IntraVENous Q6H PRN   ??? insulin lispro (HUMALOG) injection   SubCUTAneous TIDAC   ??? lactated Ringers infusion  100 mL/hr IntraVENous CONTINUOUS   ??? 0.9% sodium chloride infusion 250 mL  250 mL IntraVENous PRN   ??? diphenhydrAMINE (BENADRYL) capsule 25 mg  25 mg Oral Q6H PRN   ??? 0.9% sodium chloride infusion 250 mL  250 mL IntraVENous PRN   ??? ascorbic acid (vitamin C) (VITAMIN C) tablet 500 mg  500 mg Oral DAILY   ??? multivitamin (MULTI-DELYN, WELLESSE) oral liquid 5 mL  5 mL Oral DAILY   ??? potassium chloride (KLOR-CON) packet for solution 20 mEq  20 mEq Oral DAILY   ??? amiodarone (CORDARONE) tablet 400 mg  400 mg Oral DAILY   ??? pravastatin (PRAVACHOL) tablet 80 mg  80 mg Oral QHS   ??? isosorbide mononitrate ER (IMDUR) tablet 60 mg  60 mg Oral DAILY   ??? lacosamide (VIMPAT) tablet 150 mg  150 mg Oral BID        Review of Systems:  ROS was obtained, with pertinent positives as listed above.  No chest pain or SOB.    Diet:  Regular    Objective:   Vitals:  Visit Vitals  BP 116/54 (BP 1 Location: Left arm, BP Patient Position: At rest)   Pulse 70   Temp 97.8 ??F (36.6 ??C)   Resp 18   Ht 6\' 1"  (1.854 m)   Wt 78.9 kg (173 lb 15.1 oz)   SpO2 96%   BMI 22.95 kg/m??     Intake/Output:  No intake/output data recorded.  07/23 1901 - 07/25 0700  In: 630 [P.O.:480; I.V.:150]  Out: 0   Exam:  General appearance: alert, cooperative, no distress  Lungs: clear to auscultation bilaterally anteriorly  Heart: regular rate and rhythm. Murmur 3/6 noted  Abdomen: soft, mild RLQ/LLQ TTP. Bowel sounds normal. No masses, no organomegaly  Extremities: extremities normal, atraumatic, no cyanosis or edema  Neuro:  alert and oriented    Data Review (Labs):    Recent Labs     12/05/17  0524 12/04/17  2039 12/04/17  1319 12/04/17  0547 12/03/17  1641 12/03/17  1408   WBC 7.8  --   --   --   --  6.1   HGB 10.2* 9.9* 9.0* 8.9* 9.5* 9.4*   HCT 33.4* 32.3* 30.3* 29.5* 31.5* 31.3*   PLT 176  --   --   --   --  187   MCV 80.1  --   --   --   --  78.3*   NA 144  --   --   --   --  141   K 4.1  --   --   --   --  3.9   CL 106  --   --   --   --  104   CO2 32  --   --   --   --  30   BUN 16  --   --   --   --  17   CREA 1.35  --   --   --   --  1.46   CA 8.4  --   --   --   --  8.7   GLU 98  --   --   --   --  111*   AP 63  --   --   --   --  66   SGOT 19  --   --   --   --  20   ALT 19  --   --   --   --  29   TBILI 0.6  --   --   --   --  0.4   ALB 3.1*  --   --   --   --  3.4   TP 6.0*  --   --   --   --  6.6     Esophagogastroduodenoscopy with biopsy 12/04/17  FINDINGS:  Esophagus: Normal??  Stomach:  Two diminutive polyps of fundus, removed with cold forceps.  Diffuse mucosal atrophy.??  Duodenum: Normal  Estimated blood loss:  0 cc-minimal  IMPRESSION: No evidence of bleeding from upper GI tract.  I think the bleeding is from his Crohn's ileitis   PLAN: Stop PPI.  Start iron.  Discuss management of Crohn's with patient.??  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 male with PMH of (but not limited to) Crohn's ileitis with resection and currently not on medical treatment, A fib with multiple ablations on Eliquis and [facility] with return appt with them next month, DM, HTN, Atrial valve repair followed by Dr. Jiles Prows. [place], PVC, Melanoma, Cardiac defribillator, Seizures, OSA, who is seen in consultation at the request of Dr.K. Mewborn for melena and anemia. He has a heme positive stool with "black tarry stools" over the last 4 days. Hgb on admission 9.4  with previous Hgb in our chart reveal Hgb 12-13 range.He has been on Eliquis and Aleve and likely has an UGIB. PUD, NSAID induced gastritis, Dieulafoy lesion and AVM are potential differentials.   ??  Mr. Lorie PhenixSpangler has known Crohn's disease with previous resection of his small bowel. He was last evaluated in our office in 12/2016 by Dr. Marisa SprinklesMazanec and recommended Entyvio.He has failed Remicade previously  and has been off all medical therapy for years. He had active disease on recent colonoscopy 06/2016.     Plan:     -Follow up on Hep B testing and Quantiferon Gold results  -Office will begin process to start pt on Entyvio  -GI will sign off and arrange for office follow up appt    Geoffery Spruce, APRN    Patient is seen and examined in collaboration with Dr. Floyce Stakes  Assessment and plan as per Dr. Floyce Stakes.    Reviewed with NP and agree with above.  I recommend resuming Eliquis on D/C

## 2017-12-05 NOTE — Progress Notes (Signed)
Hourly rounds performed during this shift; all needs met at this time. Bed in low/locked position and call light, personal items within reach.

## 2017-12-05 NOTE — Discharge Summary (Signed)
Hospitalist Discharge Summary     Admit Date:  12-29-17  2:07 PM   Name:  Lance Bradford   Age:  77 y.o.  DOB:  03-May-1941   MRN:  [id number]   PCP:  Clarita Leber, MD  Treatment Team: Attending Provider: Otilio Miu, MD; Utilization Review: Tyson Dense, RN; Consulting Provider: Peterson Lombard, MD; Primary Nurse: Bernerd Pho    Problem List for this Hospitalization:  Hospital Problems as of 12/05/2017 Date Reviewed: 12-29-17          Codes Class Noted - Resolved POA    * (Principal) GI bleed ICD-10-CM: K92.2  ICD-9-CM: 578.9  12-29-17 - Present Yes        A-fib ([place]) ICD-10-CM: I48.91  ICD-9-CM: 427.31  12-29-17 - Present Unknown        Crohn's disease ([place]) ICD-10-CM: K50.90  ICD-9-CM: 555.9  December 29, 2017 - Present Unknown        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 ICD-10-CM: Z45.02  ICD-9-CM: V53.32  12-29-17 - Present Unknown        Seizure ([place]) ICD-10-CM: R56.9  ICD-9-CM: 780.39  12-29-17 - Present Unknown        HTN (hypertension) ICD-10-CM: I10  ICD-9-CM: 401.9  12-29-17 - Present Unknown        S/P AVR ICD-10-CM: Z95.2  ICD-9-CM: V43.3  12-29-17 - Present Unknown                Admission HPI from Dec 29, 2017:    "Review H&P for details of admission  "    Hospital Course:    Patient is a 77 yo male with significant PMH of crohn's ileitis with resection, afib on eliquis, aortic valve replacement, multiple ablations, ventricular arrhythmia s/p ICD, HTN, elevated lipids, CAD, AVM's, who came into the ER with reports of melena stools x 5 days with  DOE, dizziness, BLE edema. Denies CP, weakness, dizziness, cough. Does report lower abdominal tenderness. Hgb 9.4 on admission and dropped to 8.9. Patient received one unit PRBC. GI consulted. Patient had EGD 7/24.   With dx of atrophic gastritis and gastric polyps, with acute blood loss anemia likely caused  from Crohn's ileitis. Patient will follow up as OP  for Hep B testing and Quantiferon Gold results. Office will begin process to start patient on Entyvio. Patient to follow up with PCP in 3-5 days with CBC. Patient cleared by GI to restart Eliquis. Patient started on triamcinolone cream for eczema rash on left lower abdomen.           Follow up instructions and discharge meds at bottom of this note.  Plan was discussed with patient.  All questions answered.  Patient was stable at time of discharge.    Diagnostic Imaging/Tests:   Xr Chest Sngl V    Result Date: December 29, 2017  Portable chest x-ray CLINICAL INDICATION: Rhonchi on exam FINDINGS: Single AP view the chest submitted without comparison show the lungs to be expanded and clear. No pleural effusion or pneumothorax. 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 A left-sided pacer device in place.     IMPRESSION: No acute cardiopulmonary abnormality.      Echocardiogram results:  No results found for this visit on 12-29-17.      All Micro Results     Procedure Component Value Units Date/Time    Ronney Asters PLUS [[id number]] Collected:  12/05/17 0524    Order Status:  Completed Specimen:  Blood Updated:  12/05/17 7106  Labs: Results:       BMP, Mg, Phos Recent Labs     12/05/17  0524 12/03/17  1408   NA 144 141   K 4.1 3.9   CL 106 104   CO2 32 30   AGAP 6* 7   BUN 16 17   CREA 1.35 1.46   CA 8.4 8.7   GLU 98 111*      CBC Recent Labs     12/05/17  0524 12/04/17  2039 12/04/17  1319  12/03/17  1408   WBC 7.8  --   --   --  6.1   RBC 4.17*  --   --   --  4.00*   HGB 10.2* 9.9* 9.0*   < > 9.4*   HCT 33.4* 32.3* 30.3*   < > 31.3*   PLT 176  --   --   --  187   GRANS 72  --   --   --  71   LYMPH 15  --   --   --  20   EOS 5  --   --   --  3   MONOS 7  --   --   --  6   BASOS 1  --   --   --  1   IG 0  --   --   --  0   ANEU 5.6  --   --   --  4.3   ABL 1.2  --   --   --  1.2   ABE 0.4  --   --   --  0.2   ABM 0.5  --   --   --  0.4   ABB 0.1  --   --   --  0.1   AIG 0.0  --   --   --  0.0     < > = values in this interval not displayed.      LFT Recent Labs     12/05/17  0524 12/03/17  1408   SGOT 19 20   ALT 19 29   AP 63 66   TP 6.0* 6.6   ALB 3.1* 3.4   GLOB 2.9 3.2   AGRAT 1.1* 1.1*      Cardiac Testing Lab Results   Component Value Date/Time    BNP 558 (H) 12/03/2017 07:04 PM      Coagulation Tests No results found for: PTP, INR, APTT   A1c No results found for: HBA1C, HGBE8, HBA1CEXT, HBA1CEXT   Lipid Panel No results found for: CHOL, CHOLPOCT, CHOLX, CHLST, CHOLV, 884269, HDL, LDL, LDLC, DLDLP, 332951, VLDLC, VLDL, TGLX, TRIGL, TRIGP, TGLPOCT, CHHD, CHHDX   Thyroid Panel No results found for: TSH, T4, FT4, TT3, T3U, TSHEXT, TSHEXT     Most Recent UA No results found for: COLOR, APPRN, REFSG, PHU, PROTU, GLUCU, KETU, BILU, BLDU, UROU, NITU, LEUKU     Allergies   Allergen Reactions   ??? Latex Other (comments)     Blisters,  itching   ??? Codeine Other (comments)     Chest pain    ??? Morphine Other (comments)     Morphine doesn't work    ??? Prozac [Fluoxetine] Other (comments)     "turn red in the sun"    ??? Tape [Adhesive] Other (comments)     Blisters        There is no immunization history on file for this patient.  All Labs from Last 24 Hrs:  Recent Results (from the past 24 hour(s))   HGB & HCT    Collection Time: 12/04/17  1:19 PM   Result Value Ref Range    HGB 9.0 (L) 13.6 - 17.2 g/dL    HCT 30.3 (L) 41.1 - 50.3 %   HGB & HCT    Collection Time: 12/04/17  8:39 PM   Result Value Ref Range    HGB 9.9 (L) 13.6 - 17.2 g/dL    HCT 32.3 (L) 41.1 - 95.1 %   METABOLIC PANEL, COMPREHENSIVE    Collection Time: 12/05/17  5:24 AM   Result Value Ref Range    Sodium 144 136 - 145 mmol/L    Potassium 4.1 3.5 - 5.1 mmol/L    Chloride 106 98 - 107 mmol/L    CO2 32 21 - 32 mmol/L    Anion gap 6 (L) 7 - 16 mmol/L    Glucose 98 65 - 100 mg/dL    BUN 16 8 - 23 MG/DL    Creatinine 1.35 0.8 - 1.5 MG/DL    GFR est AA >60 >60 ml/min/1.9m    GFR est non-AA 55 (L) >60 ml/min/1.789m   Calcium 8.4 8.3 - 10.4 MG/DL     Bilirubin, total 0.6 0.2 - 1.1 MG/DL    ALT (SGPT) 19 12 - 65 U/L    AST (SGOT) 19 15 - 37 U/L    Alk. phosphatase 63 50 - 136 U/L    Protein, total 6.0 (L) 6.3 - 8.2 g/dL    Albumin 3.1 (L) 3.2 - 4.6 g/dL    Globulin 2.9 2.3 - 3.5 g/dL    A-G Ratio 1.1 (L) 1.2 - 3.5     CBC WITH AUTOMATED DIFF    Collection Time: 12/05/17  5:24 AM   Result Value Ref Range    WBC 7.8 4.3 - 11.1 K/uL    RBC 4.17 (L) 4.23 - 5.6 M/uL    HGB 10.2 (L) 13.6 - 17.2 g/dL    HCT 33.4 (L) 41.1 - 50.3 %    MCV 80.1 79.6 - 97.8 FL    MCH 24.5 (L) 26.1 - 32.9 PG    MCHC 30.5 (L) 31.4 - 35.0 g/dL    RDW 16.9 (H) 11.9 - 14.6 %    PLATELET 176 150 - 450 K/uL    MPV 10.4 9.4 - 12.3 FL    ABSOLUTE NRBC 0.00 0.0 - 0.2 K/uL    DF AUTOMATED      NEUTROPHILS 72 43 - 78 %    LYMPHOCYTES 15 13 - 44 %    MONOCYTES 7 4.0 - 12.0 %    EOSINOPHILS 5 0.5 - 7.8 %    BASOPHILS 1 0.0 - 2.0 %    IMMATURE GRANULOCYTES 0 0.0 - 5.0 %    ABS. NEUTROPHILS 5.6 1.7 - 8.2 K/UL    ABS. LYMPHOCYTES 1.2 0.5 - 4.6 K/UL    ABS. MONOCYTES 0.5 0.1 - 1.3 K/UL    ABS. EOSINOPHILS 0.4 0.0 - 0.8 K/UL    ABS. BASOPHILS 0.1 0.0 - 0.2 K/UL    ABS. IMM. GRANS. 0.0 0.0 - 0.5 K/UL   HEP B SURFACE AB    Collection Time: 12/05/17  5:24 AM   Result Value Ref Range    Hepatitis B surface Ab <3.10 mIU/mL   GLUCOSE, POC    Collection Time: 12/05/17  8:36 AM   Result Value Ref Range  Glucose (POC) 147 (H) 65 - 100 mg/dL       Discharge Exam:  Patient Vitals for the past 24 hrs:   Temp Pulse Resp BP SpO2   12/05/17 0842 97.8 ??F (36.6 ??C) 70 18 116/54 96 %   12/05/17 0350 98.2 ??F (36.8 ??C) 69 16 107/57 98 %   12/04/17 2328 98 ??F (36.7 ??C) 70 18 99/45 95 %   12/04/17 1912 98.4 ??F (36.9 ??C) 70 18 109/49 96 %   12/04/17 1745 97.9 ??F (36.6 ??C) 70 18 103/52 98 %   12/04/17 1623 97.6 ??F (36.4 ??C) 70 20 94/44 97 %   12/04/17 1530 97.4 ??F (36.3 ??C) 70 19 106/48 99 %   12/04/17 1506 ??? 70 18 98/47 97 %   12/04/17 1438 97.5 ??F (36.4 ??C) 70 18 101/54 97 %   12/04/17 1214 ??? 69 20 113/53 96 %    12/04/17 1203 ??? 69 22 102/51 95 %   12/04/17 1153 ??? 69 20 101/49 95 %   12/04/17 1143 ??? 69 20 ??? 95 %   12/04/17 1133 ??? 71 18 97/49 95 %   12/04/17 1123 98.6 ??F (37 ??C) 69 14 100/49 100 %   12/04/17 1108 ??? 69 12 127/58 100 %     Oxygen Therapy  O2 Sat (%): 96 % (12/05/17 0842)  Pulse via Oximetry: 70 beats per minute (12/04/17 1214)  O2 Device: Room air (12/04/17 1214)  O2 Flow Rate (L/min): 3 l/min (12/04/17 1123)    Intake/Output Summary (Last 24 hours) at 12/05/2017 1056  Last data filed at 12/04/2017 1745  Gross per 24 hour   Intake 630 ml   Output 0 ml   Net 630 ml       General:    Well nourished.  Alert.  No distress.  Eyes:   Normal sclera.  Extraocular movements intact.  ENT:  Normocephalic, atraumatic.  Moist mucous membranes  CV:   Regular rate and rhythm.  No murmur, rub, or gallop.    Lungs:  Clear to auscultation bilaterally.  No wheezing, rhonchi, or rales.  Abdomen: Soft, nontender, nondistended. Bowel sounds normal.   Extremities: Warm and dry.  No cyanosis or edema.  Neurologic: CN II-XII grossly intact.  Sensation intact.  Skin:     No rashes or jaundice.    Psych:  Normal mood and affect.    Discharge Info:   Current Discharge Medication List      START taking these medications    Details   triamcinolone acetonide (KENALOG) 0.1 % topical cream Apply  to affected area two (2) times a day. use thin layer  Qty: 15 g, Refills: 0         CONTINUE these medications which have CHANGED    Details   ferrous sulfate (IRON) 325 mg (65 mg iron) tablet Take 1 Tab by mouth Daily (before breakfast) for 30 days.  Qty: 30 Tab, Refills: 0         CONTINUE these medications which have NOT CHANGED    Details   magnesium oxide (MAG-OX) 400 mg tablet Take 400 mg by mouth three (3) times daily.      amiodarone (PACERONE) 400 mg tablet Take 400 mg by mouth daily.      furosemide (LASIX) 20 mg tablet Take 20 mg by mouth daily.      pravastatin (PRAVACHOL) 80 mg tablet Take 80 mg by mouth nightly.       isosorbide mononitrate ER (  IMDUR) 30 mg tablet Take 60 mg by mouth two (2) times a day.      potassium chloride SR (K-TAB) 10 mEq tablet Take 20 mEq by mouth two (2) times a day.      apixaban (ELIQUIS) 5 mg tablet Take 5 mg by mouth two (2) times a day. Takes due to AVR   Last dose 05-23-16 -per pt , per Dr Guillermina [place]      multivitamin (ONE A DAY) tablet Take 1 Tab by mouth daily. Stop seven days prior to surgery per anesthesia protocol.      lacosamide (VIMPAT) 100 mg tab tablet Take 150 mg by mouth two (2) times a day. Take day of surgery per anesthesia protocol.       ascorbic acid, vitamin C, (VITAMIN C) 500 mg tablet Take 500 mg by mouth daily. Stop seven days prior to surgery per anesthesia protocol.               Disposition: home    Activity: Activity as tolerated  Diet: DIET REGULAR    Follow-up Appointments   Procedures   ??? FOLLOW UP VISIT Appointment in: 3 - 5 Days     Standing Status:   Standing     Number of Occurrences:   1     Order Specific Question:   Appointment in     Answer:   3 - 5 Days         Follow-up Information     Follow up With Specialties Details Why Contact Info    Peterson Lombard, MD Gastroenterology  office will set up follow up  [address]  [address]  [place] [address]  [phone]      Clarita Leber, MD Internal Medicine In 3 days office will call patient with appointment date and time. [address]  [place] [address]  [phone]            Time spent in patient discharge planning and coordination 35 minutes.    Discharge plan discussed with Dr. Tamala Julian,   Signed:  Murvin Natal, NP

## 2017-12-05 NOTE — Progress Notes (Signed)
Pt to DC home today with no DC needs voiced at this time.     Care Management Interventions  PCP Verified by CM: Yes  Transition of Care Consult (CM Consult): Discharge Planning  Current Support Network: Own Home  Confirm Follow Up Transport: Family  Plan discussed with Pt/Family/Caregiver: Yes  Freedom of Choice Offered: Yes  Discharge Location  Discharge Placement: Home

## 2017-12-05 NOTE — Progress Notes (Signed)
Open chart for NP Irine SealKandra Black to obtain information about Gi doctor

## 2017-12-05 NOTE — Progress Notes (Signed)
Progress Notes by Montel CulverGaines, Johnrobert Foti Isaac, MD at 12/05/17 [phone]                Author: Montel CulverGaines, Lance Petraglia Isaac, MD  Service: Nurse Practitioner  Author Type: Physician       Filed: 12/05/17 1239  Date of Service: 12/05/17 0950  Status: Addendum          Editor: Montel CulverGaines, Lance Kregel Isaac, MD (Physician)          Related Notes: Original Note by Lance Bradford, Lance Bradford, Lance Bradford (Nurse Practitioner) filed at 12/05/17 1009                     [facility] Progress Note               Admit Date:  12/03/2017      Today's Date:  12/05/2017      CC:  GI bleed        Subjective:        Patient underwent EGD with Dr Floyce StakesGaines 12/04/17. There was no evidence of bleeding. 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 Hep B Ab/Ag and Quantiferon Gold ordered and pending. Plans for starting pt on Entyvio as outpatient. HGB 9.0 yesterday,  he complained of DOE and was transfused 1 unit of PRBC overnight for symptomatic anemia. HGB 10.2 today. No BM since yesterday early evening. He denies any blood in that BM. He continues to have some lower abdominal pain, rating 3/10 currently with some  cramping. No upper GI symptoms. He is tolerating diet well.       Medications:      Current Facility-Administered Medications          Medication  Dose  Route  Frequency           ?  ferrous sulfate tablet 325 mg   325 mg  Oral  ACB     ?  metoprolol tartrate (LOPRESSOR) tablet 25 mg   25 mg  Oral  BID     ?  sodium chloride (NS) flush 5-40 mL   5-40 mL  IntraVENous  Q8H     ?  sodium chloride (NS) flush 5-40 mL   5-40 mL  IntraVENous  PRN     ?  promethazine (PHENERGAN) with saline injection 12.5 mg   12.5 mg  IntraVENous  Q6H PRN     ?  insulin lispro (HUMALOG) injection     SubCUTAneous  TIDAC     ?  lactated Ringers infusion   100 mL/hr  IntraVENous  CONTINUOUS     ?  0.9% sodium chloride infusion 250 mL   250 mL  IntraVENous  PRN     ?  diphenhydrAMINE (BENADRYL) capsule 25 mg   25 mg  Oral  Q6H PRN     ?  0.9% sodium chloride infusion 250 mL    250 mL  IntraVENous  PRN           ?  ascorbic acid (vitamin C) (VITAMIN C) tablet 500 mg   500 mg  Oral  DAILY           ?  multivitamin (MULTI-DELYN, WELLESSE) oral liquid 5 mL   5 mL  Oral  DAILY     ?  potassium chloride (KLOR-CON) packet for solution 20 mEq   20 mEq  Oral  DAILY     ?  amiodarone (CORDARONE) tablet 400 mg   400 mg  Oral  DAILY     ?  pravastatin (PRAVACHOL) tablet 80 mg   80 mg  Oral  QHS     ?  isosorbide mononitrate ER (IMDUR) tablet 60 mg   60 mg  Oral  DAILY           ?  lacosamide (VIMPAT) tablet 150 mg   150 mg  Oral  BID           Review of Systems:   ROS was obtained, with pertinent positives as listed above.  No chest pain or SOB.      Diet:  Regular        Objective:     Vitals:   Visit Vitals      BP  116/54 (BP 1 Location: Left arm, BP Patient Position: At rest)     Pulse  70     Temp  97.8 ??F (36.6 ??C)     Resp  18     Ht  6\' 1"  (1.854 m)     Wt  78.9 kg (173 lb 15.1 oz)     SpO2  96%        BMI  22.95 kg/m??        Intake/Output:   No intake/output data recorded.   07/23 1901 - 07/25 0700   In: 630 [P.O.:480; I.V.:150]   Out: 0    Exam:   General appearance: alert, cooperative, no distress   Lungs: clear to auscultation bilaterally anteriorly   Heart: regular rate and rhythm. Murmur 3/6 noted   Abdomen: soft, mild RLQ/LLQ TTP. Bowel sounds normal. No masses, no organomegaly   Extremities: extremities normal, atraumatic, no cyanosis or edema   Neuro:  alert and oriented      Data Review (Labs):       Recent Labs                12/05/17   0524  12/04/17   2039  12/04/17   1319  12/04/17   0547  12/03/17   1641  12/03/17   1408     WBC  7.8   --    --    --    --   6.1     HGB  10.2*  9.9*  9.0*  8.9*  9.5*  9.4*     HCT  33.4*  32.3*  30.3*  29.5*  31.5*  31.3*     PLT  176   --    --    --    --   187     MCV  80.1   --    --    --    --   78.3*     NA  144   --    --    --    --   141     Bradford  4.1   --    --    --    --   3.9     CL  106   --    --    --    --   104     CO2  32   --     --    --    --   30     BUN  16   --    --    --    --   17     CREA  1.35   --    --    --    --  1.46     CA  8.4   --    --    --    --   8.7     GLU  98   --    --    --    --   111*     AP  63   --    --    --    --   66     SGOT  19   --    --    --    --   20     ALT  19   --    --    --    --   29     TBILI  0.6   --    --    --    --   0.4     ALB  3.1*   --    --    --    --   3.4             TP  6.0*   --    --    --    --   6.6        Esophagogastroduodenoscopy with biopsy 12/04/17   FINDINGS:   Esophagus: Normal??   Stomach:  Two diminutive polyps of fundus, removed with cold forceps.  Diffuse mucosal atrophy.??   Duodenum: Normal  Estimated blood loss:  0 cc-minimal  IMPRESSION: No evidence of bleeding from upper GI tract.  I think the bleeding is from his Crohn's ileitis  PLAN: Stop PPI.  Start iron.  Discuss management of Crohn's with patient.??   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 male with PMH of (but not limited to) Crohn's ileitis with resection and currently not on medical treatment, A fib with multiple ablations on Eliquis and followed  at [facility] with return appt with them next month, DM, HTN, Atrial valve repair followed by Dr. Jiles Prows, PVC, Melanoma, Cardiac defribillator, Seizures, OSA, who is seen in consultation at the request of Dr.K. Mewborn for melena and anemia. He has a  heme positive stool with "black tarry stools" over the last 4 days. Hgb on admission 9.4  with previous Hgb in our chart reveal Hgb 12-13 range.He has been on Eliquis and Aleve and likely has an UGIB. PUD, NSAID induced gastritis, Dieulafoy lesion and  AVM are potential differentials.     ??   Lance Bradford has known Crohn's disease with previous resection of his small bowel. He was last evaluated in our office in 12/2016 by Dr. Marisa Sprinkles and recommended Entyvio.He has  failed Remicade previously and has been off all medical therapy for years. He had active disease on recent colonoscopy 06/2016.         Plan:        -Follow up on Hep B testing and Quantiferon Gold results   -  Office will begin process to start pt on Entyvio   -GI will sign off and arrange for office follow up appt     Geoffery Spruce, APRN      Patient is seen and examined in collaboration with Dr. Floyce Stakes  Assessment and plan as per Dr. Floyce Stakes.      Reviewed with Lance Bradford and agree with above.  I recommend resuming Eliquis on D/C

## 2017-12-05 NOTE — Progress Notes (Signed)
Discharge instructions and prescriptions given and reviewed with pt, verbalizes understanding, pt to be discharged home, waiting on ride, to call desk when ready to leave.

## 2017-12-05 NOTE — Progress Notes (Signed)
Pres[facility] [place], pt aware.

## 2017-12-05 NOTE — Discharge Summary (Signed)
Discharge Summary by Murvin Natal, NP at 12/05/17 [place]                Author: Murvin Natal, NP  Service: Internal Medicine  Author Type: Nurse Practitioner       Filed: 12/05/17 1057  Date of Service: 12/05/17 1040  Status: Signed           Editor: Murvin Natal, NP (Nurse Practitioner)  Cosigner: Fleet Contras, MD at 12/05/17 1845                             Hospitalist Discharge Summary        Admit Date:  12/03/2017  2:07 PM    Name:  Lance Bradford    Age:  77 y.o.   DOB:  08/13/1940    MRN:  [id number]    PCP:  Clarita Leber, MD   Treatment Team: Attending Provider: Otilio Miu, MD; Utilization Review: Tyson Dense, RN; Consulting Provider: Peterson Lombard, MD; Primary Nurse: Bernerd Pho      Problem List for this Hospitalization:      Hospital Problems  as of 12/05/2017  Date Reviewed:  12/03/2017                         Codes  Class  Noted - Resolved  POA              * (Principal) GI bleed  ICD-10-CM: K92.2   ICD-9-CM: 578.9    12/03/2017 - Present  Yes                        A-fib ([place])  ICD-10-CM: I48.91   ICD-9-CM: 427.31    12/03/2017 - Present  Unknown                        Crohn's disease ([place])  ICD-10-CM: K50.90   ICD-9-CM: 555.9    12/03/2017 - Present  Unknown                        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  ICD-10-CM: Z45.02   ICD-9-CM: V53.32    12/03/2017 - Present  Unknown                        Seizure ([place])  ICD-10-CM: R56.9   ICD-9-CM: 780.39    12/03/2017 - Present  Unknown                        HTN (hypertension)  ICD-10-CM: I10   ICD-9-CM: 401.9    12/03/2017 - Present  Unknown                        S/P AVR  ICD-10-CM: Z95.2   ICD-9-CM: V43.3    12/03/2017 - Present  Unknown                             Admission HPI from 12/03/2017:     "Review H&P for details of admission  "      Hospital Course:      Patient is a 77 yo male with significant PMH of crohn's ileitis with resection, afib on eliquis, aortic valve  replacement, multiple ablations, ventricular  arrhythmia s/p ICD, HTN, elevated lipids, CAD, AVM's, who came into the ER with reports of melena  stools x 5 days with  DOE, dizziness, BLE edema. Denies CP, weakness, dizziness, cough. Does report lower abdominal tenderness. Hgb 9.4 on admission and dropped to 8.9. Patient received one unit PRBC. GI consulted. Patient had EGD 7/24.    With dx of atrophic gastritis and gastric polyps, with acute blood loss anemia likely caused  from Crohn's ileitis. Patient will follow up as OP for Hep B testing and Quantiferon Gold results. Office will begin process to start patient on Entyvio. Patient  to follow up with PCP in 3-5 days with CBC. Patient cleared by GI to restart Eliquis. Patient started on triamcinolone cream for eczema rash on left lower abdomen.                Follow up instructions and discharge meds at bottom of this note.  Plan was discussed with patient.  All questions answered.   Patient was stable at time of discharge.      Diagnostic Imaging/Tests:    Xr Chest Sngl V      Result Date: 12/03/2017   Portable chest x-ray CLINICAL INDICATION: Rhonchi on exam FINDINGS: Single AP view the chest submitted without comparison show the lungs to be expanded and clear. No pleural effusion or pneumothorax. 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 A left-sided pacer device  in place.       IMPRESSION: No acute cardiopulmonary abnormality.         Echocardiogram results:   No results found for this visit on 12/03/17.           All Micro Results               Procedure  Component  Value  Units  Date/Time           QUANTIFERON-TB GOLD PLUS [[id number]]  Collected:  12/05/17 0524            Order Status:  Completed  Specimen:  Blood  Updated:  12/05/17 0536                   Labs:  Results:                  BMP, Mg, Phos  Recent Labs         12/05/17   0524  12/03/17   1408      NA  144  141      K  4.1  3.9      CL  106  104      CO2  32  30      AGAP  6*  7      BUN  16  17      CREA  1.35   1.46      CA  8.4  8.7      GLU  98  111*                 CBC  Recent Labs         12/05/17   0524  12/04/17   2039  12/04/17   1319    12/03/17   1408      WBC  7.8   --    --    --   6.1      RBC  4.17*   --    --    --   4.00*  HGB  10.2*  9.9*  9.0*    < >  9.4*      HCT  33.4*  32.3*  30.3*    < >  31.3*      PLT  176   --    --    --   187      GRANS  72   --    --    --   71      LYMPH  15   --    --    --   20      EOS  5   --    --    --   3      MONOS  7   --    --    --   6      BASOS  1   --    --    --   1      IG  0   --    --    --   0      ANEU  5.6   --    --    --   4.3      ABL  1.2   --    --    --   1.2      ABE  0.4   --    --    --   0.2      ABM  0.5   --    --    --   0.4      ABB  0.1   --    --    --   0.1      AIG  0.0   --    --    --   0.0       < > = values in this interval not displayed.                 LFT  Recent Labs         12/05/17   0524  12/03/17   1408      SGOT  19  20      ALT  19  29      AP  63  66      TP  6.0*  6.6      ALB  3.1*  3.4      GLOB  2.9  3.2      AGRAT  1.1*  1.1*              Cardiac Testing  Lab Results      Component  Value  Date/Time        BNP  558 (H)  12/03/2017 07:04 PM              Coagulation Tests  No results found for: PTP, INR, APTT     A1c  No results found for: HBA1C, HGBE8, HBA1CEXT, HBA1CEXT     Lipid Panel  No results found for: CHOL, CHOLPOCT, CHOLX, CHLST, CHOLV, 884269, HDL, LDL, LDLC, DLDLP, 638756, VLDLC, VLDL, TGLX, TRIGL, TRIGP, TGLPOCT, CHHD, CHHDX     Thyroid Panel  No results found for: TSH, T4, FT4, TT3, T3U, TSHEXT, TSHEXT       Most Recent UA  No results found for: COLOR, APPRN, REFSG, PHU, PROTU, GLUCU, KETU, BILU, BLDU, UROU, NITU, LEUKU  Allergies        Allergen  Reactions         ?  Latex  Other (comments)             Blisters,  itching         ?  Codeine  Other (comments)             Chest pain          ?  Morphine  Other (comments)             Morphine doesn't work          ?  Prozac [Fluoxetine]  Other  (comments)             "turn red in the sun"          ?  Tape [Adhesive]  Other (comments)             Blisters            There is no immunization history on file for this patient.      All Labs from Last 24 Hrs:     Recent Results (from the past 24 hour(s))     HGB & HCT          Collection Time: 12/04/17  1:19 PM         Result  Value  Ref Range            HGB  9.0 (L)  13.6 - 17.2 g/dL       HCT  30.3 (L)  41.1 - 50.3 %       HGB & HCT          Collection Time: 12/04/17  8:39 PM         Result  Value  Ref Range            HGB  9.9 (L)  13.6 - 17.2 g/dL       HCT  32.3 (L)  41.1 - 57.3 %       METABOLIC PANEL, COMPREHENSIVE          Collection Time: 12/05/17  5:24 AM         Result  Value  Ref Range            Sodium  144  136 - 145 mmol/L       Potassium  4.1  3.5 - 5.1 mmol/L       Chloride  106  98 - 107 mmol/L       CO2  32  21 - 32 mmol/L       Anion gap  6 (L)  7 - 16 mmol/L       Glucose  98  65 - 100 mg/dL       BUN  16  8 - 23 MG/DL       Creatinine  1.35  0.8 - 1.5 MG/DL       GFR est AA  >60  >60 ml/min/1.13m       GFR est non-AA  55 (L)  >60 ml/min/1.767m      Calcium  8.4  8.3 - 10.4 MG/DL       Bilirubin, total  0.6  0.2 - 1.1 MG/DL       ALT (SGPT)  19  12 - 65 U/L       AST (SGOT)  19  15 - 37 U/L       Alk.  phosphatase  63  50 - 136 U/L       Protein, total  6.0 (L)  6.3 - 8.2 g/dL       Albumin  3.1 (L)  3.2 - 4.6 g/dL       Globulin  2.9  2.3 - 3.5 g/dL       A-G Ratio  1.1 (L)  1.2 - 3.5         CBC WITH AUTOMATED DIFF          Collection Time: 12/05/17  5:24 AM         Result  Value  Ref Range            WBC  7.8  4.3 - 11.1 K/uL       RBC  4.17 (L)  4.23 - 5.6 M/uL       HGB  10.2 (L)  13.6 - 17.2 g/dL       HCT  33.4 (L)  41.1 - 50.3 %       MCV  80.1  79.6 - 97.8 FL       MCH  24.5 (L)  26.1 - 32.9 PG       MCHC  30.5 (L)  31.4 - 35.0 g/dL       RDW  16.9 (H)  11.9 - 14.6 %       PLATELET  176  150 - 450 K/uL       MPV  10.4  9.4 - 12.3 FL       ABSOLUTE NRBC  0.00  0.0 - 0.2 K/uL        DF  AUTOMATED          NEUTROPHILS  72  43 - 78 %       LYMPHOCYTES  15  13 - 44 %       MONOCYTES  7  4.0 - 12.0 %       EOSINOPHILS  5  0.5 - 7.8 %       BASOPHILS  1  0.0 - 2.0 %       IMMATURE GRANULOCYTES  0  0.0 - 5.0 %       ABS. NEUTROPHILS  5.6  1.7 - 8.2 K/UL       ABS. LYMPHOCYTES  1.2  0.5 - 4.6 K/UL       ABS. MONOCYTES  0.5  0.1 - 1.3 K/UL       ABS. EOSINOPHILS  0.4  0.0 - 0.8 K/UL       ABS. BASOPHILS  0.1  0.0 - 0.2 K/UL       ABS. IMM. GRANS.  0.0  0.0 - 0.5 K/UL       HEP B SURFACE AB          Collection Time: 12/05/17  5:24 AM         Result  Value  Ref Range            Hepatitis B surface Ab  <3.10  mIU/mL       GLUCOSE, POC          Collection Time: 12/05/17  8:36 AM         Result  Value  Ref Range            Glucose (POC)  147 (H)  65 - 100 mg/dL           Discharge Exam:   Patient Vitals for the past 24  hrs:            Temp  Pulse  Resp  BP  SpO2            12/05/17 0842  97.8 ??F (36.6 ??C)  70  18  116/54  96 %            12/05/17 0350  98.2 ??F (36.8 ??C)  69  16  107/57  98 %     12/04/17 2328  98 ??F (36.7 ??C)  70  18  99/45  95 %     12/04/17 1912  98.4 ??F (36.9 ??C)  70  18  109/49  96 %     12/04/17 1745  97.9 ??F (36.6 ??C)  70  18  103/52  98 %     12/04/17 1623  97.6 ??F (36.4 ??C)  70  20  94/44  97 %     12/04/17 1530  97.4 ??F (36.3 ??C)  70  19  106/48  99 %     12/04/17 1506  --  70  18  98/47  97 %     12/04/17 1438  97.5 ??F (36.4 ??C)  70  18  101/54  97 %     12/04/17 1214  --  69  20  113/53  96 %     12/04/17 1203  --  69  22  102/51  95 %     12/04/17 1153  --  69  20  101/49  95 %     12/04/17 1143  --  69  20  --  95 %     12/04/17 1133  --  71  18  97/49  95 %     12/04/17 1123  98.6 ??F (37 ??C)  69  14  100/49  100 %            12/04/17 1108  --  69  12  127/58  100 %        Oxygen Therapy   O2 Sat (%): 96 % (12/05/17 0842)   Pulse via Oximetry: 70 beats per minute (12/04/17 1214)   O2 Device: Room air (12/04/17 1214)   O2 Flow Rate (L/min): 3 l/min (12/04/17 1123)       Intake/Output Summary (Last 24 hours) at 12/05/2017 1056   Last data filed at 12/04/2017 1745     Gross per 24 hour        Intake  630 ml        Output  0 ml        Net  630 ml          General:    Well nourished.  Alert.  No distress.   Eyes:   Normal sclera.  Extraocular movements intact.   ENT:  Normocephalic, atraumatic.  Moist mucous membranes   CV:   Regular rate and rhythm.  No murmur, rub, or gallop.     Lungs:  Clear to auscultation bilaterally.  No wheezing, rhonchi, or rales.   Abdomen: Soft, nontender, nondistended. Bowel sounds normal.    Extremities: Warm and dry.  No cyanosis or edema.   Neurologic: CN II-XII grossly intact.  Sensation intact.   Skin:     No rashes or jaundice.     Psych:  Normal mood and affect.      Discharge Info:      Current Discharge Medication List  START taking these medications          Details        triamcinolone acetonide (KENALOG) 0.1 % topical cream  Apply  to affected area two (2) times a day. use thin layer   Qty: 15 g, Refills:  0                     CONTINUE these medications which have CHANGED          Details        ferrous sulfate (IRON) 325 mg (65 mg iron) tablet  Take 1 Tab by mouth Daily (before breakfast) for 30 days.   Qty: 30 Tab, Refills:  0                     CONTINUE these medications which have NOT CHANGED          Details        magnesium oxide (MAG-OX) 400 mg tablet  Take 400 mg by mouth three (3) times daily.               amiodarone (PACERONE) 400 mg tablet  Take 400 mg by mouth daily.               furosemide (LASIX) 20 mg tablet  Take 20 mg by mouth daily.               pravastatin (PRAVACHOL) 80 mg tablet  Take 80 mg by mouth nightly.               isosorbide mononitrate ER (IMDUR) 30 mg tablet  Take 60 mg by mouth two (2) times a day.               potassium chloride SR (K-TAB) 10 mEq tablet  Take 20 mEq by mouth two (2) times a day.               apixaban (ELIQUIS) 5 mg tablet  Take 5 mg by mouth two (2) times a day. Takes due to AVR     Last dose 05-23-16 -per pt , per Dr Guillermina [place]               multivitamin (ONE A DAY) tablet  Take 1 Tab by mouth daily. Stop seven days prior to surgery per anesthesia protocol.               lacosamide (VIMPAT) 100 mg tab tablet  Take 150 mg by mouth two (2) times a day. Take day of surgery per anesthesia protocol.                ascorbic acid, vitamin C, (VITAMIN C) 500 mg tablet  Take 500 mg by mouth daily. Stop seven days prior to surgery per anesthesia protocol.                            Disposition: home     Activity: Activity as tolerated   Diet: DIET REGULAR        Follow-up Appointments       Procedures        ?  FOLLOW UP VISIT Appointment in: 3 - 5 Days              Standing Status:    Standing         Number of Occurrences:    1  Order Specific Question:    Appointment in              Answer:    3 - 5 Days                Follow-up Information               Follow up With  Specialties  Details  Why  Contact Info              Peterson Lombard, MD  Gastroenterology    office will set up follow up   [address]   [address]   [place] [address]   [phone]                 Clarita Leber, MD  Internal Medicine  In 3 days  office will call patient with appointment date and time.  [address]   [address]   [place] [address]   [phone]                   Time spent in patient discharge planning and coordination 35 minutes.      Discharge plan discussed with Dr. Tamala Julian,    Signed:   Murvin Natal, NP

## 2017-12-05 NOTE — Progress Notes (Signed)
Pt to DC home today with no DC needs voiced at this time.     Care Management Interventions  PCP Verified by CM: Yes  Transition of Care Consult (CM Consult): Discharge Planning  Current Support Network: Own Home  Confirm Follow Up Transport: Family  Plan discussed with Pt/Family/Caregiver: Yes  Freedom of Choice Offered: Yes  Discharge Location  Discharge Placement: Home

## 2017-12-05 NOTE — Progress Notes (Signed)
Open chart for NP Kandra Black to obtain information about Gi doctor

## 2017-12-06 LAB — HEPATITIS B SURFACE AG W/REFLEX
Hep B surface Ag screen: NEGATIVE
Hepatitis B Surface Ag: NEGATIVE

## 2017-12-07 LAB — TYPE & SCREEN
ABO/Rh(D): O POS
Antibody screen: NEGATIVE
Status of unit: TRANSFUSED
Unit division: 0
Unit division: 0

## 2017-12-07 LAB — TYPE AND SCREEN
ABO/Rh: O POS
Antibody Screen: NEGATIVE
Status: TRANSFUSED
Unit Divison: 0
Unit Divison: 0

## 2017-12-10 LAB — QUANTIFERON-TB GOLD PLUS
QUANTIFERON PLUS, QF2T: NEGATIVE
QuantiFERON Plus: NEGATIVE

## 2017-12-10 LAB — QUANTIFERON-TB PLUS,RFLX
QUANTIFERON TB1 AG: 0.7 IU/mL
QUANTIFERON TB2 AG: 0.43 IU/mL
QuantiFERON Mitogen Value: >10 IU/mL
QuantiFERON Mitogen Value: >10 IU/mL
QuantiFERON Nil Value: 0.53 IU/mL
QuantiFERON Nil Value: 0.53 IU/mL
QuantiFERON TB1 Ag: 0.7 IU/mL
QuantiFERON TB2 Ag: 0.43 IU/mL

## 2018-02-09 ENCOUNTER — Emergency Department: Admit: 2018-02-10 | Payer: MEDICARE | Primary: Internal Medicine

## 2018-02-09 ENCOUNTER — Inpatient Hospital Stay
Admit: 2018-02-09 | Discharge: 2018-02-10 | Disposition: A | Discharge status: Home / Self Care | Payer: MEDICARE | Attending: Emergency Medicine

## 2018-02-09 DIAGNOSIS — K50019 Crohn's disease of small intestine with unspecified complications: Secondary | ICD-10-CM

## 2018-02-09 NOTE — ED Triage Notes (Signed)
Patient states he is having a Chron's flare up that began around 0300. States he has had diarrhea about 15 times today with some spotting of blood. Reports lower abdominal pain all across abdomen that is beginning to come up on both sides of abdomen.

## 2018-02-09 NOTE — ED Provider Notes (Signed)
The history is provided by the patient.   Abdominal Pain    This is a new problem. Episode onset: This afternoon. The problem occurs constantly. The problem has been gradually worsening. Associated with: Diarrhea, nausea, history of Crohn's disease. The pain is located in the RLQ, LLQ and suprapubic region. The quality of the pain is sharp and cramping. The pain is severe. Associated symptoms include diarrhea and nausea. Pertinent negatives include no fever, no hematochezia, no melena, no vomiting, no constipation, no dysuria, no frequency, no chest pain and no back pain. Nothing worsens the pain. The pain is relieved by nothing.        Past Medical History:   Diagnosis Date   ??? Arthritis     hips and back    ??? Atrial fibrillation (HCC)     several ablations    ??? Cancer (HCC)     melenoma    ??? 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 Colon polyps     cancerous - removed    ??? Crohn disease (HCC)    ??? 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 last A1C ~ 7.1   ??? Hypertension     simce AVR BP is stable    ??? Liver disease     jaundice 1952   ??? Nausea & vomiting     two occasions    ??? PVC (premature ventricular contraction)     one ablation done    ??? S/P AVR    ??? Seizures (HCC)     most recent 04-24-16   ??? Sleep apnea     does not have to use C PAP after 20 pound weight loss        Past Surgical History:   Procedure Laterality Date   ??? COLONOSCOPY N/A 06/28/2016    COLONOSCOPY/ 25 / PT HAS ICD performed by Dionne Milo, MD at [facility] ENDOSCOPY   ??? HX AORTIC VALVE REPLACEMENT     ??? HX APPENDECTOMY     ??? HX COLECTOMY      times 4 or 5 - different times    ??? HX COLONOSCOPY     ??? HX HEART CATHETERIZATION      times 2   ??? HX HEMORRHOIDECTOMY     ??? HX HERNIA REPAIR Bilateral    ??? HX SHOULDER ARTHROSCOPY Right    ??? HX VASECTOMY           Family History:   Problem Relation Age of Onset   ??? Cancer Mother    ??? Hypertension Mother    ??? Cancer Father    ??? Diabetes Father    ???  Hypertension Father        Social History     Socioeconomic History   ??? Marital status: MARRIED     Spouse name: Not on file   ??? Number of children: Not on file   ??? Years of education: Not on file   ??? Highest education level: Not on file   Occupational History   ??? Not on file   Social Needs   ??? Financial resource strain: Not on file   ??? Food insecurity:     Worry: Not on file     Inability: Not on file   ??? Transportation needs:     Medical: Not on file     Non-medical: Not on file  Tobacco Use   ??? Smoking status: Former Smoker     Packs/day: 1.00     Years: 13.00     Pack years: 13.00     Last attempt to quit: 07/10/1976     Years since quitting: 41.6   ??? Smokeless tobacco: Never Used   Substance and Sexual Activity   ??? Alcohol use: Yes     Comment: seldom - once a year    ??? Drug use: No   ??? Sexual activity: Not on file   Lifestyle   ??? Physical activity:     Days per week: Not on file     Minutes per session: Not on file   ??? Stress: Not on file   Relationships   ??? Social connections:     Talks on phone: Not on file     Gets together: Not on file     Attends religious service: Not on file     Active member of club or organization: Not on file     Attends meetings of clubs or organizations: Not on file     Relationship status: Not on file   ??? Intimate partner violence:     Fear of current or ex partner: Not on file     Emotionally abused: Not on file     Physically abused: Not on file     Forced sexual activity: Not on file   Other Topics Concern   ??? Not on file   Social History Narrative   ??? Not on file         ALLERGIES: Latex; Codeine; Morphine; Prozac [fluoxetine]; and Tape [adhesive]    Review of Systems   Constitutional: Negative for fever.   HENT: Negative for congestion and rhinorrhea.    Respiratory: Negative for cough and shortness of breath.    Cardiovascular: Negative for chest pain.   Gastrointestinal: Positive for abdominal distention, abdominal pain, diarrhea and nausea. Negative for constipation,  hematochezia, melena and vomiting.   Genitourinary: Negative for dysuria and frequency.   Musculoskeletal: Negative for back pain.       There were no vitals filed for this visit.         Physical Exam   Constitutional: He is oriented to person, place, and time. He appears well-developed and well-nourished.   Intermittent bouts of cramping pain occur   HENT:   Mouth/Throat: Oropharynx is clear and moist. No oropharyngeal exudate.   Eyes: Pupils are equal, round, and reactive to light. Conjunctivae are normal.   Neck: Neck supple.   Cardiovascular: Normal rate, regular rhythm and normal heart sounds.   Pulmonary/Chest: Effort normal and breath sounds normal.   Abdominal: Soft. Bowel sounds are normal. He exhibits no distension. There is generalized tenderness. There is no rigidity, no rebound and no guarding.   Musculoskeletal: Normal range of motion. He exhibits no edema or tenderness.   Lymphadenopathy:     He has no cervical adenopathy.   Neurological: He is alert and oriented to person, place, and time.   Skin: Skin is warm and dry.   Nursing note and vitals reviewed.       MDM  Number of Diagnoses or Management Options  Diagnosis management comments: Patient is recently started [facility] for his Crohn's.  He has been in the loading dose phase.  He is not exactly certain when his last dose was or when his next dose is due.  After that it occurs every 8 weeks.  Patient was admitted  few days ago for cardiac reasons but had no MI.  Now with 15-17 episodes of clear runny diarrhea today and constant abdominal pain with intermittent exacerbations of more cramping severe pain in the lower abdomen.  Some abdominal bloating is reported.  Patient reports a large amount of gas.  CT to evaluate for Crohn's flare versus diverticulitis or bowel obstruction.        Discussed with GI they will 150 mg of methylprednisolone given in the ER and they will see him in the office today decide whether to keep him on and taper or other  treatment.       Amount and/or Complexity of Data Reviewed  Clinical lab tests: ordered and reviewed  Tests in the radiology section of CPT??: ordered and reviewed           Procedures

## 2018-02-09 NOTE — ED Notes (Signed)
Patient states he is having a Chron's flare up that began around 0300. States he has had diarrhea about 15 times today with some spotting of blood. Reports lower abdominal pain all across abdomen that is beginning to come up on both sides of abdomen.

## 2018-02-09 NOTE — ED Provider Notes (Addendum)
The history is provided by the patient.   Abdominal Pain    This is a new problem. Episode onset: This afternoon. The problem occurs constantly. The problem has been gradually worsening. Associated with: Diarrhea, nausea, history of Crohn's disease. The pain is located in the RLQ, LLQ and suprapubic region. The quality of the pain is sharp and cramping. The pain is severe. Associated symptoms include diarrhea and nausea. Pertinent negatives include no fever, no hematochezia, no melena, no vomiting, no constipation, no dysuria, no frequency, no chest pain and no back pain. Nothing worsens the pain. The pain is relieved by nothing.        Past Medical History:   Diagnosis Date   ??? Arthritis     hips and back    ??? Atrial fibrillation (HCC)     several ablations    ??? Cancer (HCC)     melenoma    ??? 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 Colon polyps     cancerous - removed    ??? Crohn disease (HCC)    ??? 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 last A1C ~ 7.1   ??? Hypertension     simce AVR BP is stable    ??? Liver disease     jaundice 1952   ??? Nausea & vomiting     two occasions    ??? PVC (premature ventricular contraction)     one ablation done    ??? S/P AVR    ??? Seizures (HCC)     most recent 04-24-16   ??? Sleep apnea     does not have to use C PAP after 20 pound weight loss        Past Surgical History:   Procedure Laterality Date   ??? COLONOSCOPY N/A 06/28/2016    COLONOSCOPY/ 25 / PT HAS ICD performed by Dionne Milo, MD at [facility] ENDOSCOPY   ??? HX AORTIC VALVE REPLACEMENT     ??? HX APPENDECTOMY     ??? HX COLECTOMY      times 4 or 5 - different times    ??? HX COLONOSCOPY     ??? HX HEART CATHETERIZATION      times 2   ??? HX HEMORRHOIDECTOMY     ??? HX HERNIA REPAIR Bilateral    ??? HX SHOULDER ARTHROSCOPY Right    ??? HX VASECTOMY           Family History:   Problem Relation Age of Onset   ??? Cancer Mother    ??? Hypertension Mother    ??? Cancer Father    ??? Diabetes Father     ??? Hypertension Father        Social History     Socioeconomic History   ??? Marital status: MARRIED     Spouse name: Not on file   ??? Number of children: Not on file   ??? Years of education: Not on file   ??? Highest education level: Not on file   Occupational History   ??? Not on file   Social Needs   ??? Financial resource strain: Not on file   ??? Food insecurity:     Worry: Not on file     Inability: Not on file   ??? Transportation needs:     Medical: Not on file     Non-medical: Not on file  Tobacco Use   ??? Smoking status: Former Smoker     Packs/day: 1.00     Years: 13.00     Pack years: 13.00     Last attempt to quit: 07/10/1976     Years since quitting: 41.6   ??? Smokeless tobacco: Never Used   Substance and Sexual Activity   ??? Alcohol use: Yes     Comment: seldom - once a year    ??? Drug use: No   ??? Sexual activity: Not on file   Lifestyle   ??? Physical activity:     Days per week: Not on file     Minutes per session: Not on file   ??? Stress: Not on file   Relationships   ??? Social connections:     Talks on phone: Not on file     Gets together: Not on file     Attends religious service: Not on file     Active member of club or organization: Not on file     Attends meetings of clubs or organizations: Not on file     Relationship status: Not on file   ??? Intimate partner violence:     Fear of current or ex partner: Not on file     Emotionally abused: Not on file     Physically abused: Not on file     Forced sexual activity: Not on file   Other Topics Concern   ??? Not on file   Social History Narrative   ??? Not on file         ALLERGIES: Latex; Codeine; Morphine; Prozac [fluoxetine]; and Tape [adhesive]    Review of Systems   Constitutional: Negative for fever.   HENT: Negative for congestion and rhinorrhea.    Respiratory: Negative for cough and shortness of breath.    Cardiovascular: Negative for chest pain.   Gastrointestinal: Positive for abdominal distention, abdominal pain,  diarrhea and nausea. Negative for constipation, hematochezia, melena and vomiting.   Genitourinary: Negative for dysuria and frequency.   Musculoskeletal: Negative for back pain.       There were no vitals filed for this visit.         Physical Exam   Constitutional: He is oriented to person, place, and time. He appears well-developed and well-nourished.   Intermittent bouts of cramping pain occur   HENT:   Mouth/Throat: Oropharynx is clear and moist. No oropharyngeal exudate.   Eyes: Pupils are equal, round, and reactive to light. Conjunctivae are normal.   Neck: Neck supple.   Cardiovascular: Normal rate, regular rhythm and normal heart sounds.   Pulmonary/Chest: Effort normal and breath sounds normal.   Abdominal: Soft. Bowel sounds are normal. He exhibits no distension. There is generalized tenderness. There is no rigidity, no rebound and no guarding.   Musculoskeletal: Normal range of motion. He exhibits no edema or tenderness.   Lymphadenopathy:     He has no cervical adenopathy.   Neurological: He is alert and oriented to person, place, and time.   Skin: Skin is warm and dry.   Nursing note and vitals reviewed.       MDM  Number of Diagnoses or Management Options  Diagnosis management comments: Patient is recently started [facility] for his Crohn's.  He has been in the loading dose phase.  He is not exactly certain when his last dose was or when his next dose is due.  After that it occurs every 8 weeks.  Patient was admitted  few days ago for cardiac reasons but had no MI.  Now with 15-17 episodes of clear runny diarrhea today and constant abdominal pain with intermittent exacerbations of more cramping severe pain in the lower abdomen.  Some abdominal bloating is reported.  Patient reports a large amount of gas.  CT to evaluate for Crohn's flare versus diverticulitis or bowel obstruction.        Discussed with GI they will 150 mg of methylprednisolone given in the ER  and they will see him in the office today decide whether to keep him on and taper or other treatment.       Amount and/or Complexity of Data Reviewed  Clinical lab tests: ordered and reviewed  Tests in the radiology section of CPT??: ordered and reviewed           Procedures

## 2018-02-10 LAB — METABOLIC PANEL, COMPREHENSIVE
A-G Ratio: 0.8 — ABNORMAL LOW (ref 1.2–3.5)
ALT (SGPT): 26 U/L (ref 12–65)
AST (SGOT): 26 U/L (ref 15–37)
Albumin: 3.2 g/dL (ref 3.2–4.6)
Alk. phosphatase: 74 U/L (ref 50–136)
Anion gap: 6 mmol/L — ABNORMAL LOW (ref 7–16)
BUN: 10 MG/DL (ref 8–23)
Bilirubin, total: 0.4 MG/DL (ref 0.2–1.1)
CO2: 28 mmol/L (ref 21–32)
Calcium: 8.7 MG/DL (ref 8.3–10.4)
Chloride: 107 mmol/L (ref 98–107)
Creatinine: 1.45 MG/DL (ref 0.8–1.5)
GFR est AA: >60 mL/min/{1.73_m2} (ref >60)
GFR est non-AA: 51 mL/min/{1.73_m2} — ABNORMAL LOW (ref >60)
Globulin: 3.9 g/dL — ABNORMAL HIGH (ref 2.3–3.5)
Glucose: 100 mg/dL (ref 65–100)
Potassium: 4 mmol/L (ref 3.5–5.1)
Protein, total: 7.1 g/dL (ref 6.3–8.2)
Sodium: 141 mmol/L (ref 136–145)

## 2018-02-10 LAB — CBC WITH AUTOMATED DIFF
ABS. BASOPHILS: 0 10*3/uL (ref 0.0–0.2)
ABS. EOSINOPHILS: 0.2 10*3/uL (ref 0.0–0.8)
ABS. IMM. GRANS.: 0 10*3/uL (ref 0.0–0.5)
ABS. LYMPHOCYTES: 2.1 10*3/uL (ref 0.5–4.6)
ABS. MONOCYTES: 0.7 10*3/uL (ref 0.1–1.3)
ABS. NEUTROPHILS: 5.3 10*3/uL (ref 1.7–8.2)
ABSOLUTE NRBC: 0 10*3/uL (ref 0.0–0.2)
BASOPHILS: 1 % (ref 0.0–2.0)
EOSINOPHILS: 3 % (ref 0.5–7.8)
HCT: 41 % — ABNORMAL LOW (ref 41.1–50.3)
HGB: 12.8 g/dL — ABNORMAL LOW (ref 13.6–17.2)
IMMATURE GRANULOCYTES: 0 % (ref 0.0–5.0)
LYMPHOCYTES: 25 % (ref 13–44)
MCH: 26.3 PG (ref 26.1–32.9)
MCHC: 31.2 g/dL — ABNORMAL LOW (ref 31.4–35.0)
MCV: 84.4 FL (ref 79.6–97.8)
MONOCYTES: 8 % (ref 4.0–12.0)
MPV: 10.6 FL (ref 9.4–12.3)
NEUTROPHILS: 63 % (ref 43–78)
PLATELET: 237 10*3/uL (ref 150–450)
RBC: 4.86 M/uL (ref 4.23–5.6)
RDW: 18.5 % — ABNORMAL HIGH (ref 11.9–14.6)
WBC: 8.3 10*3/uL (ref 4.3–11.1)

## 2018-02-10 LAB — LIPASE
Lipase: 148 U/L (ref 73–393)
Lipase: 148 U/L (ref 73–393)

## 2018-02-10 LAB — CBC WITH AUTO DIFFERENTIAL
Basophils %: 1 % (ref 0.0–2.0)
Basophils Absolute: 0 10*3/uL (ref 0.0–0.2)
Eosinophils %: 3 % (ref 0.5–7.8)
Eosinophils Absolute: 0.2 10*3/uL (ref 0.0–0.8)
Granulocyte Absolute Count: 0 10*3/uL (ref 0.0–0.5)
Hematocrit: 41 % — ABNORMAL LOW (ref 41.1–50.3)
Hemoglobin: 12.8 g/dL — ABNORMAL LOW (ref 13.6–17.2)
Immature Granulocytes: 0 % (ref 0.0–5.0)
Lymphocytes %: 25 % (ref 13–44)
Lymphocytes Absolute: 2.1 10*3/uL (ref 0.5–4.6)
MCH: 26.3 PG (ref 26.1–32.9)
MCHC: 31.2 g/dL — ABNORMAL LOW (ref 31.4–35.0)
MCV: 84.4 FL (ref 79.6–97.8)
MPV: 10.6 FL (ref 9.4–12.3)
Monocytes %: 8 % (ref 4.0–12.0)
Monocytes Absolute: 0.7 10*3/uL (ref 0.1–1.3)
NRBC Absolute: 0 10*3/uL (ref 0.0–0.2)
Neutrophils %: 63 % (ref 43–78)
Neutrophils Absolute: 5.3 10*3/uL (ref 1.7–8.2)
Platelets: 237 10*3/uL (ref 150–450)
RBC: 4.86 M/uL (ref 4.23–5.6)
RDW: 18.5 % — ABNORMAL HIGH (ref 11.9–14.6)
WBC: 8.3 10*3/uL (ref 4.3–11.1)

## 2018-02-10 LAB — COMPREHENSIVE METABOLIC PANEL
ALT: 26 U/L (ref 12–65)
AST: 26 U/L (ref 15–37)
Albumin/Globulin Ratio: 0.8 — ABNORMAL LOW (ref 1.2–3.5)
Albumin: 3.2 g/dL (ref 3.2–4.6)
Alkaline Phosphatase: 74 U/L (ref 50–136)
Anion Gap: 6 mmol/L — ABNORMAL LOW (ref 7–16)
BUN: 10 MG/DL (ref 8–23)
CO2: 28 mmol/L (ref 21–32)
Calcium: 8.7 MG/DL (ref 8.3–10.4)
Chloride: 107 mmol/L (ref 98–107)
Creatinine: 1.45 MG/DL (ref 0.8–1.5)
EGFR IF NonAfrican American: 51 mL/min/{1.73_m2} — ABNORMAL LOW (ref >60)
GFR African American: >60 mL/min/{1.73_m2} (ref >60)
Globulin: 3.9 g/dL — ABNORMAL HIGH (ref 2.3–3.5)
Glucose: 100 mg/dL (ref 65–100)
Potassium: 4 mmol/L (ref 3.5–5.1)
Sodium: 141 mmol/L (ref 136–145)
Total Bilirubin: 0.4 MG/DL (ref 0.2–1.1)
Total Protein: 7.1 g/dL (ref 6.3–8.2)

## 2018-02-10 MED ORDER — HYDROMORPHONE (PF) 1 MG/ML IJ SOLN
1 mg/mL | INTRAMUSCULAR | Status: AC
Start: 2018-02-10 — End: 2018-02-10
  Administered 2018-02-10: 04:00:00 via INTRAVENOUS

## 2018-02-10 MED ORDER — IOPAMIDOL 76 % IV SOLN
370 mg iodine /mL (76 %) | Freq: Once | INTRAVENOUS | Status: AC
Start: 2018-02-10 — End: 2018-02-10
  Administered 2018-02-10: 05:00:00 via INTRAVENOUS

## 2018-02-10 MED ORDER — SODIUM CHLORIDE 0.9% BOLUS IV
0.9 % | Freq: Once | INTRAVENOUS | Status: AC
Start: 2018-02-10 — End: 2018-02-10
  Administered 2018-02-10: 05:00:00 via INTRAVENOUS

## 2018-02-10 MED ORDER — HYDROMORPHONE (PF) 1 MG/ML IJ SOLN
1 mg/mL | INTRAMUSCULAR | Status: AC
Start: 2018-02-10 — End: 2018-02-09
  Administered 2018-02-10: 03:00:00 via INTRAVENOUS

## 2018-02-10 MED ORDER — DIATRIZOATE MEGLUMINE & SODIUM 66 %-10 % ORAL SOLN
66-10 % | Freq: Once | ORAL | Status: AC
Start: 2018-02-10 — End: 2018-02-09
  Administered 2018-02-10: 03:00:00 via ORAL

## 2018-02-10 MED ORDER — SALINE PERIPHERAL FLUSH PRN
Freq: Once | INTRAMUSCULAR | Status: AC
Start: 2018-02-10 — End: 2018-02-10
  Administered 2018-02-10: 07:00:00

## 2018-02-10 MED ORDER — METHYLPREDNISOLONE (PF) 40 MG/ML IJ SOLR
40 mg/mL | Freq: Once | INTRAMUSCULAR | Status: AC
Start: 2018-02-10 — End: 2018-02-10
  Administered 2018-02-10: 07:00:00 via INTRAVENOUS

## 2018-02-10 MED ORDER — ONDANSETRON (PF) 4 MG/2 ML INJECTION
4 mg/2 mL | INTRAMUSCULAR | Status: AC
Start: 2018-02-10 — End: 2018-02-09
  Administered 2018-02-10: 03:00:00 via INTRAVENOUS

## 2018-02-10 MED FILL — HYDROMORPHONE (PF) 1 MG/ML IJ SOLN: 1 mg/mL | INTRAMUSCULAR | Qty: 1

## 2018-02-10 MED FILL — ONDANSETRON (PF) 4 MG/2 ML INJECTION: 4 mg/2 mL | INTRAMUSCULAR | Qty: 2

## 2018-02-10 MED FILL — MD-GASTROVIEW 66 %-10 % ORAL SOLUTION: 66-10 % | ORAL | Qty: 30

## 2018-02-10 MED FILL — SOLU-MEDROL (PF) 40 MG/ML SOLUTION FOR INJECTION: 40 mg/mL | INTRAMUSCULAR | Qty: 2

## 2018-02-10 NOTE — ED Notes (Signed)
 I have reviewed discharge instructions with the patient.  The patient verbalized understanding.    Patient left ED via Discharge Method: ambulatory to Home with (insert name of family/friend, self, transport family).    Opportunity for questions and clarification provided.       Patient given 0 scripts.   Patient is to follow up with gastroenterology tomorrow morning        To continue your aftercare when you leave the hospital, you may receive an automated call from our care team to check in on how you are doing.  This is a free service and part of our promise to provide the best care and service to meet your aftercare needs." If you have questions, or wish to unsubscribe from this service please call [phone].  Thank you for Choosing our [facility] Emergency Department.

## 2018-02-10 NOTE — ED Notes (Signed)
I have reviewed discharge instructions with the patient.  The patient verbalized understanding.    Patient left ED via Discharge Method: ambulatory to Home with (insert name of family/friend, self, transport family).    Opportunity for questions and clarification provided.       Patient given 0 scripts.   Patient is to follow up with gastroenterology tomorrow morning        To continue your aftercare when you leave the hospital, you may receive an automated call from our care team to check in on how you are doing.  This is a free service and part of our promise to provide the best care and service to meet your aftercare needs.??? If you have questions, or wish to unsubscribe from this service please call [phone].  Thank you for Choosing our  Emergency Department.

## 2019-12-29 ENCOUNTER — Inpatient Hospital Stay: Primary: Internal Medicine

## 2019-12-30 ENCOUNTER — Encounter

## 2019-12-31 ENCOUNTER — Institutional Professional Consult (permissible substitution): Payer: MEDICARE | Primary: Internal Medicine

## 2019-12-31 DIAGNOSIS — Z20822 Contact with and (suspected) exposure to covid-19: Secondary | ICD-10-CM

## 2019-12-31 NOTE — Progress Notes (Signed)
Late Entry - Patient presented to the [facility] for COVID testing as ordered. After verbal consent was given by patient/caregiver, nasal swab obtained and sent to lab for processing.

## 2020-01-01 NOTE — Progress Notes (Signed)
Unable to reach patient via phone to confirm procedure time, arrival time, driver policy, and required COVID 19 testing.

## 2020-01-01 NOTE — Interval H&P Note (Signed)
Patient verified name, DOB, and procedure.  Patient states that he had his covid test 12/31/19 at [facility].    Type: 1a; abbreviated assessment per anesthesia guidelines    Labs per anesthesia: not indicated    Dr. Caffie Pinto notified of the case,that the patient has a [employer] ICD, has never been shocked.  Per Dr. Caffie Pinto a pacer rep is not needed the DOS.  Becky in scheduling notified to add ICD to case posting.     Instructed pt that they will be notified the day before their procedure by the GI Lab for time of arrival if their procedure is [facility] and Pre-op for [place] cases. Arrival times should be called by 5 pm. If no phone is received the patient should contact their respective hospital. The GI lab telephone number is [phone] and ES Pre-op is [phone].     Follow diet and prep instructions per office including NPO status.  If patient has NOT received instructions from office patient is advised to call surgeon office, verbalizes understanding.    Bath or shower the night before and the am of surgery with non-mositurizing soap. No lotions, oils, powders, cologne on skin. No make up, eye make up or jewelry. Wear loose fitting comfortable, clean clothing.     Must have adult present in building the entire time .     Medications for the day of procedure amiodarone, vimpat, use albuterol inhaler if needed and to bring the DOS, famotidine if needed, omeprazole, isosorbide, bring nitroglycerin the DOS,  patient to hold eliquis as instructed the surgeon and cardiologist. Patient instructed to hold vitamins and NSAIDS now for surgery.    The following discharge instructions reviewed with patient: medication given during procedure may cause drowsiness for several hours, therefore, do not drive or operate machinery for remainder of the day. You may not drink alcohol on the day of your procedure, please resume regular diet and activity unless otherwise directed. You may experience abdominal distention for several  hours that is relieved by the passage of gas. Contact your physician if you have any of the following: fever or chills, severe abdominal pain or excessive amount of bleeding or a large amount when having a bowel movement. Occasional specks of blood with bowel movement would not be unusual.       Patient teach back successful, patient verbalized understanding of instructions

## 2020-01-02 LAB — SARS-COV-2, NAA 2 DAY TAT

## 2020-01-02 LAB — INPATIENT

## 2020-01-02 LAB — NOVEL CORONAVIRUS (COVID-19): SARS-CoV-2, NAA: NOT DETECTED

## 2020-01-02 LAB — COVID-19: SARS-CoV-2, NAA: NOT DETECTED

## 2020-01-04 ENCOUNTER — Ambulatory Visit

## 2020-01-04 NOTE — Progress Notes (Signed)
Attempted to call patient to see if they were coming for their procedure since no arrival yet. Number listed is disconnected.

## 2021-05-11 ENCOUNTER — Emergency Department (HOSPITAL_COMMUNITY)
Admission: EM | Admit: 2021-05-11 | Discharge: 2021-05-12 | Disposition: A | Discharge status: Home / Self Care | Payer: Medicare Other | Attending: Emergency Medicine | Admitting: Emergency Medicine

## 2021-05-11 ENCOUNTER — Other Ambulatory Visit: Payer: Self-pay

## 2021-05-11 ENCOUNTER — Emergency Department (HOSPITAL_COMMUNITY): Payer: Medicare Other

## 2021-05-11 ENCOUNTER — Encounter (HOSPITAL_COMMUNITY): Payer: Self-pay | Admitting: Emergency Medicine

## 2021-05-11 DIAGNOSIS — E119 Type 2 diabetes mellitus without complications: Secondary | ICD-10-CM | POA: Diagnosis not present

## 2021-05-11 DIAGNOSIS — R079 Chest pain, unspecified: Secondary | ICD-10-CM

## 2021-05-11 DIAGNOSIS — R072 Precordial pain: Secondary | ICD-10-CM | POA: Insufficient documentation

## 2021-05-11 DIAGNOSIS — Z7901 Long term (current) use of anticoagulants: Secondary | ICD-10-CM | POA: Insufficient documentation

## 2021-05-11 DIAGNOSIS — Z950 Presence of cardiac pacemaker: Secondary | ICD-10-CM | POA: Insufficient documentation

## 2021-05-11 DIAGNOSIS — Z87891 Personal history of nicotine dependence: Secondary | ICD-10-CM | POA: Insufficient documentation

## 2021-05-11 HISTORY — DX: Type 2 diabetes mellitus without complications: E11.9

## 2021-05-11 LAB — CBC
HCT: 40.2 % (ref 39.0–52.0)
Hemoglobin: 12.3 g/dL — ABNORMAL LOW (ref 13.0–17.0)
MCH: 26.2 pg (ref 26.0–34.0)
MCHC: 30.6 g/dL (ref 30.0–36.0)
MCV: 85.7 fL (ref 80.0–100.0)
Platelets: 210 10*3/uL (ref 150–400)
RBC: 4.69 MIL/uL (ref 4.22–5.81)
RDW: 16.7 % — ABNORMAL HIGH (ref 11.5–15.5)
WBC: 5.6 10*3/uL (ref 4.0–10.5)
nRBC: 0 % (ref 0.0–0.2)

## 2021-05-11 LAB — BASIC METABOLIC PANEL
Anion gap: 6 (ref 5–15)
BUN: 9 mg/dL (ref 8–23)
CO2: 27 mmol/L (ref 22–32)
Calcium: 8.6 mg/dL — ABNORMAL LOW (ref 8.9–10.3)
Chloride: 104 mmol/L (ref 98–111)
Creatinine, Ser: 1.04 mg/dL (ref 0.61–1.24)
GFR, Estimated: >60 mL/min (ref >60)
Glucose, Bld: 102 mg/dL — ABNORMAL HIGH (ref 70–99)
Potassium: 3.5 mmol/L (ref 3.5–5.1)
Sodium: 137 mmol/L (ref 135–145)

## 2021-05-11 LAB — TROPONIN I (HIGH SENSITIVITY)
Troponin I (High Sensitivity): 10 ng/L (ref <18)
Troponin I (High Sensitivity): 11 ng/L (ref <18)

## 2021-05-11 NOTE — ED Provider Notes (Signed)
Emergency Medicine Provider Triage Evaluation Note  Billy Beck. , a 80 y.o. male  was evaluated in triage.  Pt complains of intermittent, sternal exertional, chest pain onset yesterday. Has associated exertional shortness of breath. Didn't try any medications for his symptoms. Denies abdominal pain, dizziness, lightheadedness, fever, chills, nausea, vomiting. Denies prior history of MI, CAD, stent placement. History of 3 cardiac ablations. Takes eliquis and plavix. Has had heart caths with his most recent being 5 years ago.  Patient has a lot of life, home stressors including his wife who has dementia and is currently in a behavioral health hospital.  Review of Systems  Positive: Chest pain, shortness of breath Negative: Abdominal pain nausea, vomiting  Physical Exam  BP 115/77    Pulse 74    Temp 98.2 F (36.8 C) (Oral)    Resp 20    SpO2 99%  Gen:   Awake, no distress   Resp:  Normal effort  MSK:   Moves extremities without difficulty  Other:  No chest wall tenderness to palpation.  No abdominal tenderness palpation.  Medical Decision Making  Medically screening exam initiated at 7:49 PM.  Appropriate orders placed.  Billy Beck. was informed that the remainder of the evaluation will be completed by another provider, this initial triage assessment does not replace that evaluation, and the importance of remaining in the ED until their evaluation is complete.   Billy Beck A, PA-C 05/11/21 Joeseph Amor, MD 05/11/21 [id number]

## 2021-05-11 NOTE — ED Notes (Signed)
X1 vitals with no response

## 2021-05-11 NOTE — ED Triage Notes (Signed)
Pt states his wife has dementia and is in a behavioral health hospital in [facility].  Pt is from [place], [place] and his wife kicked him out of house 3 days ago.  He is in [place] staying with a cousin.  Reports pain across chest x 2-3 days that has been worse since this morning with SOB.  Denies nausea and vomiting.

## 2021-05-12 MED ORDER — LORAZEPAM 1 MG PO TABS: 0.5000 mg | ORAL_TABLET | Freq: Three times a day (TID) | ORAL | 0 refills | Status: DC | PRN

## 2021-05-12 NOTE — ED Provider Notes (Signed)
[facility] EMERGENCY DEPARTMENT Provider Note   CSN: [id number] Arrival date & time: 05/11/21  1650     History Chief Complaint  Patient presents with   Chest Pain    Billy Beck. is a 80 y.o. male.  Patient with significant stressors at home with wife and when he thinks about her and his situation he gets a pressure in his chest. No palpitations. Feels stressed/worriedd. No associated n/v, sob, light headedness, diaphoresis or other issues. 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 Has had ablations. No CAD, cath a few years ago was relatively normal.    Chest Pain Pain location:  Substernal area and L chest Pain quality: aching and pressure   Pain radiates to:  Does not radiate Pain severity:  Mild Onset quality:  Gradual Duration:  4 minutes Timing:  Intermittent Progression:  Waxing and waning Chronicity:  New Context: not breathing, not intercourse, not raising an arm, not stress and not trauma   Relieved by:  None tried Worsened by:  Nothing Ineffective treatments:  None tried     Past Medical History:  Diagnosis Date   Diabetes mellitus without complication ([place])     There are no problems to display for this patient.   Past Surgical History:  Procedure Laterality Date   ABLATION     AORTIC VALVE REPLACEMENT     PACEMAKER IMPLANT         No family history on file.  Social History   Tobacco Use   Smoking status: Former    Types: Cigarettes   Smokeless tobacco: Never  Substance Use Topics   Alcohol use: Yes   Drug use: Never    Home Medications Prior to Admission medications   Medication Sig Start Date End Date Taking? Authorizing Provider  acetaminophen (TYLENOL) 650 MG CR tablet Take 650 mg by mouth every 8 (eight) hours as needed for pain.   Yes [provider]  albuterol (VENTOLIN HFA) 108 (90 Base) MCG/ACT inhaler Inhale 1 puff into the lungs every 6 (six) hours as needed for wheezing.  09/11/16  Yes [provider]  amiodarone (PACERONE) 400 MG tablet Take by mouth. 12/17/17  Yes [provider]  clopidogrel (PLAVIX) 75 MG tablet Take 75 mg by mouth daily. 05/08/21  Yes [provider]  ELIQUIS 5 MG TABS tablet Take 5 mg by mouth 2 (two) times daily. 04/10/21  Yes [provider]  famotidine (PEPCID) 20 MG tablet Take by mouth. 01/08/17  Yes [provider]  furosemide (LASIX) 20 MG tablet Take 20 mg by mouth daily. 05/08/21  Yes [provider]  hydroxyprophyl methylcellulose / hypromellose (ISOPTO TEARS) 0.5 % opthalmic solution 1 drop.   Yes [provider]  hydrOXYzine (ATARAX) 10 MG tablet Take by mouth. 01/24/21  Yes [provider]  isosorbide mononitrate (IMDUR) 60 MG 24 hr tablet Take 60 mg by mouth 2 (two) times daily. 03/14/21  Yes [provider]  LORazepam (ATIVAN) 1 MG tablet Take 0.5 tablets (0.5 mg total) by mouth 3 (three) times daily as needed for anxiety. 05/12/21  Yes Dylon Correa, Corene Cornea, MD  magnesium oxide (MAG-OX) 400 MG tablet Take by mouth. 07/24/19  Yes [provider]  metoprolol succinate (TOPROL-XL) 50 MG 24 hr tablet Take 50 mg by mouth daily. 03/15/21  Yes [provider]  sertraline (ZOLOFT) 100 MG tablet Take 100 mg by mouth 2 (two) times daily. 01/08/21  Yes [provider]  triamcinolone cream (KENALOG) 0.1 % SMARTSIG:1 Application Topical 2-3 Times Daily 04/12/21  Yes [provider]    Allergies    Codeine, Fluoxetine, Tape, Gabapentin, Morphine, Simvastatin, and Fentanyl  Review of Systems   Review of Systems  Cardiovascular:  Positive for chest pain.  All other systems reviewed and are negative.  Physical Exam Updated Vital Signs BP (!) 141/76 (BP Location: Right Arm)    Pulse 74    Temp (!) 97.4 F (36.3 C) (Oral)    Resp 16    SpO2 99%   Physical Exam Vitals and nursing note reviewed.  Constitutional:      Appearance: He is  well-developed.  HENT:     Head: Normocephalic and atraumatic.  Cardiovascular:     Rate and Rhythm: Normal rate.  Pulmonary:     Effort: Pulmonary effort is normal. No respiratory distress.  Chest:     Chest wall: No mass or tenderness.  Abdominal:     General: There is no distension.     Palpations: Abdomen is soft.  Musculoskeletal:        General: Normal range of motion.     Cervical back: Normal range of motion.  Skin:    General: Skin is warm and dry.  Neurological:     General: No focal deficit present.     Mental Status: He is alert.    ED Results / Procedures / Treatments   Labs (all labs ordered are listed, but only abnormal results are displayed) Labs Reviewed  BASIC METABOLIC PANEL - Abnormal; Notable for the following components:      Result Value   Glucose, Bld 102 (*)    Calcium 8.6 (*)    All other components within normal limits  CBC - Abnormal; Notable for the following components:   Hemoglobin 12.3 (*)    RDW 16.7 (*)    All other components within normal limits  TROPONIN I (HIGH SENSITIVITY)  TROPONIN I (HIGH SENSITIVITY)    EKG EKG Interpretation  Date/Time:  Thursday May 11 2021 17:10:09 EST Ventricular Rate:  76 PR Interval:  176 QRS Duration: 214 QT Interval:  484 QTC Calculation: 544 R Axis:   -69 Text Interpretation: AV dual-paced rhythm Abnormal ECG No previous ECGs available Confirmed by Merrily Pew [phone]) on 05/11/2021 11:24:52 PM  Radiology DG Chest 2 View  Result Date: 05/11/2021 CLINICAL DATA:  Chest pain EXAM: CHEST - 2 VIEW COMPARISON:  None. FINDINGS: Heart size and vascularity normal. Prior open heart surgery. TAVR. AICD in satisfactory position. Left atrial appendage clip in satisfactory position. Elevated right hemidiaphragm with mild right lower lobe atelectasis or scarring. Negative for pneumonia or effusion. IMPRESSION: No active cardiopulmonary disease. Electronically Signed   By: Franchot Gallo M.D.   On:  05/11/2021 18:35    Procedures Procedures   Medications Ordered in ED Medications - No data to display  ED Course  I have reviewed the triage vital signs and the nursing notes.  Pertinent labs & imaging results that were available during my care of the patient were reviewed by me and considered in my medical decision making (see chart for details).    MDM Rules/Calculators/A&P                         EKG is okay troponins are okay.  Patient describes what sounds like anxiety to me.  Did have his pacemaker interrogated did not show any evidence of arrhythmias associated  with his palpitations.  We will give him a short course of Ativan which has had before and he will follow-up with his primary doctors for further management.   Final Clinical Impression(s) / ED Diagnoses Final diagnoses:  Nonspecific chest pain    Rx / DC Orders ED Discharge Orders          Ordered    LORazepam (ATIVAN) 1 MG tablet  3 times daily PRN        05/12/21 0325             Imogen Maddalena, Corene Cornea, MD 05/13/21 [phone]

## 2021-08-31 ENCOUNTER — Ambulatory Visit: Payer: Medicare Other | Admitting: Emergency Medicine

## 2021-10-17 ENCOUNTER — Telehealth: Payer: Self-pay

## 2021-10-17 NOTE — Telephone Encounter (Signed)
Notes scanned to referral 

## 2021-10-23 ENCOUNTER — Encounter: Payer: Self-pay | Admitting: Gastroenterology

## 2021-10-25 ENCOUNTER — Ambulatory Visit: Payer: Medicare Other | Admitting: Internal Medicine

## 2021-10-31 ENCOUNTER — Encounter: Payer: Self-pay | Admitting: Cardiology

## 2021-10-31 ENCOUNTER — Ambulatory Visit (INDEPENDENT_AMBULATORY_CARE_PROVIDER_SITE_OTHER): Payer: Medicare Other | Admitting: Cardiology

## 2021-10-31 VITALS — BP 100/60 | HR 74 | Ht 73.0 in | Wt 189.0 lb

## 2021-10-31 DIAGNOSIS — I472 Ventricular tachycardia, unspecified: Secondary | ICD-10-CM | POA: Diagnosis not present

## 2021-10-31 DIAGNOSIS — I493 Ventricular premature depolarization: Secondary | ICD-10-CM

## 2021-10-31 DIAGNOSIS — Z95810 Presence of automatic (implantable) cardiac defibrillator: Secondary | ICD-10-CM

## 2021-10-31 DIAGNOSIS — Z952 Presence of prosthetic heart valve: Secondary | ICD-10-CM

## 2021-10-31 DIAGNOSIS — I4891 Unspecified atrial fibrillation: Secondary | ICD-10-CM

## 2021-10-31 DIAGNOSIS — I10 Essential (primary) hypertension: Secondary | ICD-10-CM

## 2021-10-31 DIAGNOSIS — I5032 Chronic diastolic (congestive) heart failure: Secondary | ICD-10-CM

## 2021-10-31 DIAGNOSIS — Z79899 Other long term (current) drug therapy: Secondary | ICD-10-CM

## 2021-10-31 LAB — PACEMAKER DEVICE OBSERVATION

## 2021-10-31 MED ORDER — ISOSORBIDE MONONITRATE ER 60 MG PO TB24: 60.0000 mg | ORAL_TABLET | Freq: Two times a day (BID) | ORAL | 3 refills | Status: AC

## 2021-10-31 NOTE — Progress Notes (Addendum)
Electrophysiology Office Note:    Date:  10/31/2021   ID:  Abram Sax., DOB 1940-09-07, MRN [id number]  PCP:  Dionne Ano, MD  [place] Cardiologist:  None  [facility] Electrophysiologist:  Vickie Epley, MD   Referring MD: Ward, [place], MD  s Chief Complaint: Establish care for ICD MDT  History of Present Illness:    Billy Beck. is a 81 y.o. male who presents for an evaluation of their ICD MDT. Their medical history includes atrial fibrillation s/p ablation, SVT, s/p TAVR, chronic diastolic heart failure, and hypertension.  Has been followed by Dr. Tamera Punt (Cardiology) and Dr. Leonides Schanz (EP) at [facility] in [place]. He was last seen 09/26/2021. It was noted he initially had a [place] pacemaker placed for sick sinus syndrome but this was upgraded to a dual-chamber ICD 2017 after he had sustained VT.   Today, he is generally feeling well. He denies any issues with his device. He believes that his previous pressure and worsening migraine auras were likely due to his prior life stressors.  Lately he has been semi-active, he will go walk occasionally.  He reports his original TAVR was in 2012.   He denies any palpitations, chest pain, shortness of breath, or peripheral edema. No lightheadedness, headaches, syncope, orthopnea, or PND.     Past Medical History:  Diagnosis Date   Diabetes mellitus without complication [facility])     Past Surgical History:  Procedure Laterality Date   ABLATION     AORTIC VALVE REPLACEMENT     COLONOSCOPY WITH ESOPHAGOGASTRODUODENOSCOPY (EGD)  06/28/2016   [facility]   ESOPHAGOGASTRODUODENOSCOPY  12/04/2017   [facility].   PACEMAKER IMPLANT      Current Medications: Current Meds  Medication Sig   acetaminophen (TYLENOL) 650 MG CR tablet Take 650 mg by mouth every 8 (eight) hours as needed for pain.   albuterol (VENTOLIN HFA) 108 (90 Base) MCG/ACT inhaler Inhale 1 puff into the lungs every 6  (six) hours as needed for wheezing.   amiodarone (PACERONE) 400 MG tablet Take by mouth.   clopidogrel (PLAVIX) 75 MG tablet Take 75 mg by mouth daily.   cyclobenzaprine (FLEXERIL) 5 MG tablet Take 5 mg by mouth 3 (three) times daily as needed.   ELIQUIS 5 MG TABS tablet Take 5 mg by mouth 2 (two) times daily.   famotidine (PEPCID) 20 MG tablet Take by mouth.   furosemide (LASIX) 20 MG tablet Take 20 mg by mouth daily.   hydroxyprophyl methylcellulose / hypromellose (ISOPTO TEARS) 0.5 % opthalmic solution 1 drop.   hydrOXYzine (ATARAX) 10 MG tablet Take by mouth.   isosorbide mononitrate (IMDUR) 60 MG 24 hr tablet Take 60 mg by mouth 2 (two) times daily.   Lacosamide (VIMPAT) 150 MG TABS Take 150 mg by mouth 2 (two) times daily.   LORazepam (ATIVAN) 1 MG tablet Take 0.5 tablets (0.5 mg total) by mouth 3 (three) times daily as needed for anxiety.   magnesium oxide (MAG-OX) 400 MG tablet Take by mouth.   metoprolol succinate (TOPROL-XL) 50 MG 24 hr tablet Take 50 mg by mouth daily.   sertraline (ZOLOFT) 100 MG tablet Take 100 mg by mouth 2 (two) times daily.   triamcinolone cream (KENALOG) 0.1 % SMARTSIG:1 Application Topical 2-3 Times Daily     Allergies:   Codeine, Fluoxetine, Tape, Gabapentin, Morphine, Simvastatin, and Fentanyl   Social History   Socioeconomic History   Marital status: Married  Spouse name: Not on file   Number of children: Not on file   Years of education: Not on file   Highest education level: Not on file  Occupational History   Not on file  Tobacco Use   Smoking status: Former    Types: Cigarettes   Smokeless tobacco: Never  Substance and Sexual Activity   Alcohol use: Yes   Drug use: Never   Sexual activity: Not on file  Other Topics Concern   Not on file  Social History Narrative   Not on file   Social Determinants of Health   Financial Resource Strain: Not on file  Food Insecurity: Not on file  Transportation Needs: Not on file  Physical  Activity: Not on file  Stress: Not on file  Social Connections: Not on file     Family History: The patient's family history is not on file.  ROS:   Please see the history of present illness.    All other systems reviewed and are negative.  EKGs/Labs/Other Studies Reviewed:    The following studies were reviewed today:  10/31/2021  In clinic device interrogation personally reviewed: Implant date January 06, 2020 Battery longevity 4.9 to 5.2 years Atrial lead without clear evidence of capture.  Sensing is poor as well at 0.2 mV.  Impedance is 380 ohms on the atrial lead.  Ventricular capture threshold 1.0 at 0.4 with sensing of 10.4 mV and an impedance of 340 ohms.  On atrial lead sensing, EGM reveals ventricular EGM concerning for a malpositioned atrial lead.   09/12/2021  Clinic Pacemaker Interrogation  ([place]): Personally reviewed.  06/22/2021  Nuclear Stress test  [facility]):   Resting ECG: indicated a paced rhythm.    Stress ECG: non diagnostic.    Protocol: Pharmacologic using regadenoson    Hemodynamic Response: adequate.    Symptoms: shortness of breath.    Left ventricle cavity size is normal.    LV Function: EF is 63%. LV function is normal. The wall motion is  normal.    Left ventricular perfusion: normal.    Low risk study   - At rest, there is an infero-apical defect and a basal-to-mid  inferolateral defect that is likely due to large amount of obvious  adjacent bowel uptake.  - Post-regadenoson:  Infero-apical defect persists, although improves, this is consistent with  small inferoapical infarction pattern versus apical thinning artifact, and  is low risk.  The prior basal-to-mid inferolateral defect improves to normal, consistent  with bowel attenuation artifact.   06/21/2021  Echo  ([place]):   Technically difficult study.    The left ventricular systolic function is normal (55-65%).    Unable to assess left ventricular diastolic function  (paced rhythm).    The right ventricular systolic function is normal.    The left atrium is severely dilated.    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 The prosthetic valve  function is normal.    Mild mitral valve regurgitation.    EKG:   EKG is personally reviewed.  10/31/2021: No discernible atrial activity or atrial capture with high output atrial pacing.  Ventricular pacing with intermittent PVCs.   Recent Labs: 05/11/2021: BUN 9; Creatinine, Ser 1.04; Hemoglobin 12.3; Platelets 210; Potassium 3.5; Sodium 137   Recent Lipid Panel No results found for: "CHOL", "TRIG", "HDL", "CHOLHDL", "VLDL", "LDLCALC", "LDLDIRECT"  Physical Exam:    VS:  BP 100/60 (BP Location: Left Arm, Patient Position: Sitting, Cuff Size: Normal)   Pulse 74  Ht '6\' 1"'$  (1.854 m)   Wt 189 lb (85.7 kg)   SpO2 94%   BMI 24.94 kg/m     Wt Readings from Last 3 Encounters:  10/31/21 189 lb (85.7 kg)     GEN: Well nourished, well developed in no acute distress HEENT: Normal NECK: No JVD; No carotid bruits LYMPHATICS: No lymphadenopathy CARDIAC: RRR, no murmurs, rubs, gallops;  Device pocket well healed RESPIRATORY:  Clear to auscultation without rales, wheezing or rhonchi  ABDOMEN: Soft, non-tender, non-distended MUSCULOSKELETAL:  No edema; No deformity  SKIN: Warm and dry NEUROLOGIC:  Alert and oriented x 3 PSYCHIATRIC:  Normal affect       ASSESSMENT:    1. VT (ventricular tachycardia) (HCC)   2. ICD (implantable cardioverter-defibrillator) in place   3. S/P TAVR (transcatheter aortic valve replacement)   4. Atrial fibrillation, unspecified type ([place])   5. PVC (premature ventricular contraction)   6. Primary hypertension   7. Chronic diastolic heart failure (HCC)    PLAN:    In order of problems listed above:  #Ventricular tachycardia No high-voltage therapies have been delivered He has not been takign the amiodarone  #ICD in situ #Atrial fibrillation The patient has a  history of atrial fibrillation is on Eliquis for stroke prophylaxis.   #Possible ICD lead malfunction Atrial lead with no evidence of capture and poor sensing.  There is significant amounts of ventricular signal on the atrial lead raising the question about possible lead dislodgment.  I will order 2 view chest x-ray to assess the position of the lead.  I would like to bring him back to clinic based on results of the chest x-ray.  I have reprogrammed his device to VVI 60.   #History of aortic stenosis post TAVR I will put a referral in to establish care with Dr. Burt Knack for his valvular heart disease history.  Moving forward, plan to see the patient on a yearly basis alternating with Dr. Burt Knack.  Total time spent with patient today 65 minutes. This includes reviewing records, evaluating the patient and coordinating care.  Medication Adjustments/Labs and Tests Ordered: Current medicines are reviewed at length with the patient today.  Concerns regarding medicines are outlined above.  No orders of the defined types were placed in this encounter.  No orders of the defined types were placed in this encounter.   I,Mathew Stumpf,acting as a Education administrator for Vickie Epley, MD.,have documented all relevant documentation on the behalf of Vickie Epley, MD,as directed by  Vickie Epley, MD while in the presence of Vickie Epley, MD.  I, Vickie Epley, MD, have reviewed all documentation for this visit. The documentation on 10/31/21 for the exam, diagnosis, procedures, and orders are all accurate and complete.   Signed, Hilton Cork. Quentin Ore, MD, [facility], [facility] 10/31/2021 3:03 PM    Electrophysiology [facility]

## 2021-10-31 NOTE — Patient Instructions (Addendum)
Medication Instructions:  Stop Amiodarone  Your physician recommends that you continue on your current medications as directed. Please refer to the Current Medication list given to you today. *If you need a refill on your cardiac medications before your next appointment, please call your pharmacy*  Lab Work: None. If you have labs (blood work) drawn today and your tests are completely normal, you will receive your results only by: [place] (if you have MyChart) OR A paper copy in the mail If you have any lab test that is abnormal or we need to change your treatment, we will call you to review the results.  Testing/Procedures: Chest X-ray Instructions:    1. You may have this done at the [facility], located in the [place] on the 1st floor.    2. You do no have to have an appointment.    3. [place], [place] [id number]        [phone]        Monday - Friday  8:00 am - 5:00 pm   Follow-Up: At [facility], you and your health needs are our priority.  As part of our continuing mission to provide you with exceptional heart care, we have created designated Provider Care Teams.  These Care Teams include your primary Cardiologist (physician) and Advanced Practice Providers (APPs -  Physician Assistants and Nurse Practitioners) who all work together to provide you with the care you need, when you need it.  Your physician wants you to follow-up in: 12 months with Lars Mage, MD or one of the following Advanced Practice Providers on your designated Care Team:    Tommye Standard, [place] Legrand Como "Jonni Sanger" [place], [place]   You will receive a reminder letter in the mail two months in advance. If you don't receive a letter, please call our office to schedule the follow-up appointment.  We recommend signing up for the patient portal called "MyChart".  Sign up information is provided on this After Visit Summary.  MyChart is used to connect  with patients for Virtual Visits (Telemedicine).  Patients are able to view lab/test results, encounter notes, upcoming appointments, etc.  Non-urgent messages can be sent to your provider as well.   To learn more about what you can do with MyChart, go to [email or web address].    Any Other Special Instructions Will Be Listed Below (If Applicable).

## 2021-11-01 ENCOUNTER — Ambulatory Visit (INDEPENDENT_AMBULATORY_CARE_PROVIDER_SITE_OTHER): Payer: Medicare Other | Admitting: Emergency Medicine

## 2021-11-01 ENCOUNTER — Encounter: Payer: Self-pay | Admitting: Emergency Medicine

## 2021-11-01 VITALS — BP 108/66 | HR 64 | Temp 97.8°F | Ht 73.0 in | Wt 188.1 lb

## 2021-11-01 DIAGNOSIS — I480 Paroxysmal atrial fibrillation: Secondary | ICD-10-CM

## 2021-11-01 DIAGNOSIS — Z7689 Persons encountering health services in other specified circumstances: Secondary | ICD-10-CM | POA: Diagnosis not present

## 2021-11-01 DIAGNOSIS — M150 Primary generalized (osteo)arthritis: Secondary | ICD-10-CM

## 2021-11-01 DIAGNOSIS — E119 Type 2 diabetes mellitus without complications: Secondary | ICD-10-CM

## 2021-11-01 DIAGNOSIS — Z95810 Presence of automatic (implantable) cardiac defibrillator: Secondary | ICD-10-CM

## 2021-11-01 DIAGNOSIS — M159 Polyosteoarthritis, unspecified: Secondary | ICD-10-CM

## 2021-11-01 DIAGNOSIS — Z85820 Personal history of malignant melanoma of skin: Secondary | ICD-10-CM

## 2021-11-01 DIAGNOSIS — K50919 Crohn's disease, unspecified, with unspecified complications: Secondary | ICD-10-CM | POA: Insufficient documentation

## 2021-11-01 DIAGNOSIS — I10 Essential (primary) hypertension: Secondary | ICD-10-CM

## 2021-11-01 DIAGNOSIS — I5032 Chronic diastolic (congestive) heart failure: Secondary | ICD-10-CM | POA: Insufficient documentation

## 2021-11-01 NOTE — Assessment & Plan Note (Signed)
Well-controlled hypertension. Continue metoprolol succinate 50 mg daily Continue isosorbide mononitrate 60 mg twice a day

## 2021-11-01 NOTE — Assessment & Plan Note (Signed)
Stable.  On Eliquis 5 mg twice a day. Well-controlled pacer rate. No bleeding complications. Fall precautions given. Continue metoprolol succinate 50 mg daily.

## 2021-11-01 NOTE — Assessment & Plan Note (Signed)
Well-controlled.  May take Tylenol for pain.  Advised to avoid NSAIDs.

## 2021-11-01 NOTE — Assessment & Plan Note (Signed)
Questionable lead placement problem.  Getting chest x-ray today.

## 2021-11-01 NOTE — Patient Instructions (Signed)
Health Maintenance After Age 79 After age 79, you are at a higher risk for certain long-term diseases and infections as well as injuries from falls. Falls are a major cause of broken bones and head injuries in people who are older than age 79. Getting regular preventive care can help to keep you healthy and well. Preventive care includes getting regular testing and making lifestyle changes as recommended by your health care provider. Talk with your health care provider about: Which screenings and tests you should have. A screening is a test that checks for a disease when you have no symptoms. A diet and exercise plan that is right for you. What should I know about screenings and tests to prevent falls? Screening and testing are the best ways to find a health problem early. Early diagnosis and treatment give you the best chance of managing medical conditions that are common after age 79. Certain conditions and lifestyle choices may make you more likely to have a fall. Your health care provider may recommend: Regular vision checks. Poor vision and conditions such as cataracts can make you more likely to have a fall. If you wear glasses, make sure to get your prescription updated if your vision changes. Medicine review. Work with your health care provider to regularly review all of the medicines you are taking, including over-the-counter medicines. Ask your health care provider about any side effects that may make you more likely to have a fall. Tell your health care provider if any medicines that you take make you feel dizzy or sleepy. Strength and balance checks. Your health care provider may recommend certain tests to check your strength and balance while standing, walking, or changing positions. Foot health exam. Foot pain and numbness, as well as not wearing proper footwear, can make you more likely to have a fall. Screenings, including: Osteoporosis screening. Osteoporosis is a condition that causes  the bones to get weaker and break more easily. Blood pressure screening. Blood pressure changes and medicines to control blood pressure can make you feel dizzy. Depression screening. You may be more likely to have a fall if you have a fear of falling, feel depressed, or feel unable to do activities that you used to do. Alcohol use screening. Using too much alcohol can affect your balance and may make you more likely to have a fall. Follow these instructions at home: Lifestyle Do not drink alcohol if: Your health care provider tells you not to drink. If you drink alcohol: Limit how much you have to: 0-1 drink a day for women. 0-2 drinks a day for men. Know how much alcohol is in your drink. In the [place] one drink equals one 12 oz bottle of beer (355 mL), one 5 oz glass of wine (148 mL), or one 1 oz glass of hard liquor (44 mL). Do not use any products that contain nicotine or tobacco. These products include cigarettes, chewing tobacco, and vaping devices, such as e-cigarettes. If you need help quitting, ask your health care provider. Activity  Follow a regular exercise program to stay fit. This will help you maintain your balance. Ask your health care provider what types of exercise are appropriate for you. If you need a cane or walker, use it as recommended by your health care provider. Wear supportive shoes that have nonskid soles. Safety  Remove any tripping hazards, such as rugs, cords, and clutter. Install safety equipment such as grab bars in bathrooms and safety rails on stairs. Keep rooms and walkways   well-lit. General instructions Talk with your health care provider about your risks for falling. Tell your health care provider if: You fall. Be sure to tell your health care provider about all falls, even ones that seem minor. You feel dizzy, tiredness (fatigue), or off-balance. Take over-the-counter and prescription medicines only as told by your health care provider. These include  supplements. Eat a healthy diet and maintain a healthy weight. A healthy diet includes low-fat dairy products, low-fat (lean) meats, and fiber from whole grains, beans, and lots of fruits and vegetables. Stay current with your vaccines. Schedule regular health, dental, and eye exams. Summary Having a healthy lifestyle and getting preventive care can help to protect your health and wellness after age 79. Screening and testing are the best way to find a health problem early and help you avoid having a fall. Early diagnosis and treatment give you the best chance for managing medical conditions that are more common for people who are older than age 79. Falls are a major cause of broken bones and head injuries in people who are older than age 79. Take precautions to prevent a fall at home. Work with your health care provider to learn what changes you can make to improve your health and wellness and to prevent falls. This information is not intended to replace advice given to you by your health care provider. Make sure you discuss any questions you have with your health care provider. Document Revised: 09/19/2020 Document Reviewed: 09/19/2020 Elsevier Patient Education  2023 [employer] Inc[employer]  

## 2021-11-01 NOTE — Assessment & Plan Note (Signed)
Clinically euvolemic.  No clinical findings of acute CHF. Wt Readings from Last 3 Encounters:  11/01/21 188 lb 2 oz (85.3 kg)  10/31/21 189 lb (85.7 kg)  Continue Lasix 20 mg daily.

## 2021-11-01 NOTE — Assessment & Plan Note (Signed)
Stable.  No concerns at present time.

## 2021-11-01 NOTE — Assessment & Plan Note (Signed)
Will be establishing care with local GI doctor soon.  Presently on no medications. Symptoms are affecting quality of life.

## 2021-11-01 NOTE — Progress Notes (Signed)
Billy Beck. 81 y.o.   Chief Complaint  Patient presents with   New Patient (Initial Visit)    No concerns     HISTORY OF PRESENT ILLNESS: This is a 81 y.o. male first visit to this office, here to establish care with me. Has the following chronic medical problems: 1.  Primary hypertension 2.  Paroxysmal atrial fibrillation 3.  History of malignant ventricular arrhythmias, ICD in place 4.  History of diastolic congestive heart failure 5.  Diet-controlled diabetes 6.  Crohn's disease.  Will be establishing care with local GI doctor soon 7.  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  History of aortic valve replaced x2 9.  Osteoarthritis of multiple joints Recent cardiologist office visit assessment and plan as follows: Assessment ASSESSMENT:     1. VT (ventricular tachycardia) ([place])   2. ICD (implantable cardioverter-defibrillator) in place   3. S/P TAVR (transcatheter aortic valve replacement)   4. Atrial fibrillation, unspecified type ([place])   5. PVC (premature ventricular contraction)   6. Primary hypertension   7. Chronic diastolic heart failure (HCC)     PLAN:     In order of problems listed above:   #Ventricular tachycardia No high-voltage therapies have been delivered He has not been takign the amiodarone  #ICD in situ #Atrial fibrillation The patient has a history of atrial fibrillation is on Eliquis for stroke prophylaxis.   #Possible ICD lead malfunction Atrial lead with no evidence of capture and poor sensing.  There is significant amounts of ventricular signal on the atrial lead raising the question about possible lead dislodgment.  I will order 2 view chest x-ray to assess the position of the lead.  I would like to bring him back to clinic based on results of the chest x-ray.  I have reprogrammed his device to VVI 60.     #History of aortic stenosis post TAVR I will put a referral in to establish care with Dr. Burt Knack for his valvular heart disease  history.  Moving forward, plan to see the patient on a yearly basis alternating with Dr. Burt Knack.    HPI   Prior to Admission medications   Medication Sig Start Date End Date Taking? Authorizing Provider  acetaminophen (TYLENOL) 650 MG CR tablet Take 650 mg by mouth every 8 (eight) hours as needed for pain.   Yes [provider]  albuterol (VENTOLIN HFA) 108 (90 Base) MCG/ACT inhaler Inhale 1 puff into the lungs every 6 (six) hours as needed for wheezing. 09/11/16  Yes [provider]  clopidogrel (PLAVIX) 75 MG tablet Take 75 mg by mouth daily. 05/08/21  Yes [provider]  cyclobenzaprine (FLEXERIL) 5 MG tablet Take 5 mg by mouth 3 (three) times daily as needed. 06/14/21  Yes [provider]  ELIQUIS 5 MG TABS tablet Take 5 mg by mouth 2 (two) times daily. 04/10/21  Yes [provider]  famotidine (PEPCID) 20 MG tablet Take by mouth. 01/08/17  Yes [provider]  furosemide (LASIX) 20 MG tablet Take 20 mg by mouth daily. 05/08/21  Yes [provider]  hydroxyprophyl methylcellulose / hypromellose (ISOPTO TEARS) 0.5 % opthalmic solution 1 drop.   Yes [provider]  hydrOXYzine (ATARAX) 10 MG tablet Take by mouth. 01/24/21  Yes [provider]  isosorbide mononitrate (IMDUR) 60 MG 24 hr tablet Take 1 tablet (60 mg total) by mouth 2 (two) times daily. 10/31/21  Yes Vickie Epley, MD  Lacosamide (VIMPAT) 150 MG TABS  Take 150 mg by mouth 2 (two) times daily.   Yes [provider]  LORazepam (ATIVAN) 1 MG tablet Take 0.5 tablets (0.5 mg total) by mouth 3 (three) times daily as needed for anxiety. 05/12/21  Yes Mesner, Corene Cornea, MD  magnesium oxide (MAG-OX) 400 MG tablet Take by mouth. 07/24/19  Yes [provider]  metoprolol succinate (TOPROL-XL) 50 MG 24 hr tablet Take 50 mg by mouth daily. 03/15/21  Yes [provider]  sertraline (ZOLOFT) 100 MG tablet Take 100 mg by mouth 2 (two) times  daily. 01/08/21  Yes [provider]  triamcinolone cream (KENALOG) 0.1 % SMARTSIG:1 Application Topical 2-3 Times Daily Patient not taking: Reported on 11/01/2021 04/12/21   [provider]    Allergies  Allergen Reactions   Codeine Other (See Comments)    Other reaction(s): Other - See Comments Chest pain  Chest pain  Chest pressure.  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    Tape     Other reaction(s): Other (comments) Blisters  Blisters, redness, itching PAPER TAPE PREFERRED    Gabapentin Nausea Only and Other (See Comments)    Other reaction(s): Nausea and/or vomiting-Intolerance dizziness dizziness    Morphine     Other reaction(s): Other - See Comments, Unknown Morphine doesn't work  Morphine doesn't work  "Does not work for me"    Simvastatin     Other reaction(s): Unknown   Fentanyl     Pt states "does not work"    Patient Active Problem List   Diagnosis Date Noted   Diet-controlled diabetes mellitus ([place]) 11/01/2021   Paroxysmal A-fib ([place]) 11/01/2021   Crohn's disease with complication ([place]) 16/02/9603   History of melanoma 11/01/2021   Primary osteoarthritis involving multiple joints 11/01/2021   ICD (implantable cardioverter-defibrillator) in place 11/01/2021   Primary hypertension 11/01/2021    Past Medical History:  Diagnosis Date   Diabetes mellitus without complication [facility])     Past Surgical History:  Procedure Laterality Date   ABLATION     AORTIC VALVE REPLACEMENT     COLONOSCOPY WITH ESOPHAGOGASTRODUODENOSCOPY (EGD)  06/28/2016   [facility]   ESOPHAGOGASTRODUODENOSCOPY  12/04/2017   [facility].   PACEMAKER IMPLANT      Social History   Socioeconomic History   Marital status: Married     Spouse name: Not on file   Number of children: Not on file   Years of education: Not on file   Highest education level: Not on file  Occupational History   Not on file  Tobacco Use   Smoking status: Former    Types: Cigarettes   Smokeless tobacco: Never  Substance and Sexual Activity   Alcohol use: Yes   Drug use: Never   Sexual activity: Not on file  Other Topics Concern   Not on file  Social History Narrative   Not on file   Social Determinants of Health   Financial Resource Strain: Not on file  Food Insecurity: Not on file  Transportation Needs: Not on file  Physical Activity: Not on file  Stress: Not on file  Social Connections: Not on file  Intimate Partner Violence: Not on file    History reviewed. No pertinent family history.   Review of Systems  Constitutional:  Negative.  Negative for chills and fever.  HENT: Negative.  Negative for congestion and sore throat.   Respiratory: Negative.  Negative for cough and shortness of breath.   Cardiovascular: Negative.  Negative for chest pain and palpitations.  Gastrointestinal:  Positive for abdominal pain and diarrhea. Negative for nausea and vomiting.  Genitourinary: Negative.  Negative for dysuria and hematuria.  Musculoskeletal:  Positive for joint pain.  Skin: Negative.  Negative for rash.  Neurological: Negative.  Negative for dizziness and headaches.  All other systems reviewed and are negative.  Today's Vitals   11/01/21 1508  BP: 108/66  Pulse: 64  Temp: 97.8 F (36.6 C)  TempSrc: Oral  SpO2: 97%  Weight: 188 lb 2 oz (85.3 kg)  Height: '6\' 1"'$  (1.854 m)   Body mass index is 24.82 kg/m.   Physical Exam Vitals reviewed.  Constitutional:      Appearance: Normal appearance.  HENT:     Head: Normocephalic.     Mouth/Throat:     Mouth: Mucous membranes are moist.     Pharynx: Oropharynx is clear.  Eyes:     Extraocular Movements: Extraocular movements intact.     Pupils: Pupils are equal, round,  and reactive to light.  Cardiovascular:     Rate and Rhythm: Normal rate and regular rhythm.     Pulses: Normal pulses.     Heart sounds: Normal heart sounds.  Pulmonary:     Effort: Pulmonary effort is normal.     Breath sounds: Normal breath sounds.  Musculoskeletal:     Cervical back: No tenderness.     Right lower leg: No edema.     Left lower leg: No edema.  Lymphadenopathy:     Cervical: No cervical adenopathy.  Skin:    General: Skin is warm and dry.  Neurological:     General: No focal deficit present.     Mental Status: He is alert and oriented to person, place, and time.     ASSESSMENT & PLAN: A total of 55 minutes was spent with the patient and counseling/coordination of care regarding preparing for this visit, review of available medical records, review of most recent cardiologist office visit notes, review of multiple chronic medical problems and their management, review of all medications, comprehensive history and physical examination, prognosis, documentation, and need for follow-up.  Problem List Items Addressed This Visit       Cardiovascular and Mediastinum   Paroxysmal A-fib ([place])    Stable.  On Eliquis 5 mg twice a day. Well-controlled pacer rate. No bleeding complications. Fall precautions given. Continue metoprolol succinate 50 mg daily.      Primary hypertension - Primary    Well-controlled hypertension. Continue metoprolol succinate 50 mg daily Continue isosorbide mononitrate 60 mg twice a day      Chronic diastolic heart failure (HCC)    Clinically euvolemic.  No clinical findings of acute CHF. Wt Readings from Last 3 Encounters:  11/01/21 188 lb 2 oz (85.3 kg)  10/31/21 189 lb (85.7 kg)  Continue Lasix 20 mg daily.         Endocrine   Diet-controlled diabetes mellitus ([place])    No medications.  Diet controlled.  Stable.        Musculoskeletal and Integument   Primary osteoarthritis involving multiple joints    Well-controlled.   May take Tylenol for pain.  Advised to avoid NSAIDs.        Other   Crohn's disease with complication ([place])  Will be establishing care with local GI doctor soon.  Presently on no medications. Symptoms are affecting quality of life.      History of melanoma    Stable.  No concerns at present time.      ICD (implantable cardioverter-defibrillator) in place    Questionable lead placement problem.  Getting chest x-ray today.      Other Visit Diagnoses     Encounter to establish care          Patient Instructions  Health Maintenance After Age 68 After age 43, you are at a higher risk for certain long-term diseases and infections as well as injuries from falls. Falls are a major cause of broken bones and head injuries in people who are older than age 22. Getting regular preventive care can help to keep you healthy and well. Preventive care includes getting regular testing and making lifestyle changes as recommended by your health care provider. Talk with your health care provider about: Which screenings and tests you should have. A screening is a test that checks for a disease when you have no symptoms. A diet and exercise plan that is right for you. What should I know about screenings and tests to prevent falls? Screening and testing are the best ways to find a health problem early. Early diagnosis and treatment give you the best chance of managing medical conditions that are common after age 65. Certain conditions and lifestyle choices may make you more likely to have a fall. Your health care provider may recommend: Regular vision checks. Poor vision and conditions such as cataracts can make you more likely to have a fall. If you wear glasses, make sure to get your prescription updated if your vision changes. Medicine review. Work with your health care provider to regularly review all of the medicines you are taking, including over-the-counter medicines. Ask your health care provider  about any side effects that may make you more likely to have a fall. Tell your health care provider if any medicines that you take make you feel dizzy or sleepy. Strength and balance checks. Your health care provider may recommend certain tests to check your strength and balance while standing, walking, or changing positions. Foot health exam. Foot pain and numbness, as well as not wearing proper footwear, can make you more likely to have a fall. Screenings, including: Osteoporosis screening. Osteoporosis is a condition that causes the bones to get weaker and break more easily. Blood pressure screening. Blood pressure changes and medicines to control blood pressure can make you feel dizzy. Depression screening. You may be more likely to have a fall if you have a fear of falling, feel depressed, or feel unable to do activities that you used to do. Alcohol use screening. Using too much alcohol can affect your balance and may make you more likely to have a fall. Follow these instructions at home: Lifestyle Do not drink alcohol if: Your health care provider tells you not to drink. If you drink alcohol: Limit how much you have to: 0-1 drink a day for women. 0-2 drinks a day for men. Know how much alcohol is in your drink. In the [place] one drink equals one 12 oz bottle of beer (355 mL), one 5 oz glass of wine (148 mL), or one 1 oz glass of hard liquor (44 mL). Do not use any products that contain nicotine or tobacco. These products include cigarettes, chewing tobacco, and vaping devices, such as e-cigarettes. If you need help quitting,  ask your health care provider. Activity  Follow a regular exercise program to stay fit. This will help you maintain your balance. Ask your health care provider what types of exercise are appropriate for you. If you need a cane or walker, use it as recommended by your health care provider. Wear supportive shoes that have nonskid soles. Safety  Remove any tripping  hazards, such as rugs, cords, and clutter. Install safety equipment such as grab bars in bathrooms and safety rails on stairs. Keep rooms and walkways well-lit. General instructions Talk with your health care provider about your risks for falling. Tell your health care provider if: You fall. Be sure to tell your health care provider about all falls, even ones that seem minor. You feel dizzy, tiredness (fatigue), or off-balance. Take over-the-counter and prescription medicines only as told by your health care provider. These include supplements. Eat a healthy diet and maintain a healthy weight. A healthy diet includes low-fat dairy products, low-fat (lean) meats, and fiber from whole grains, beans, and lots of fruits and vegetables. Stay current with your vaccines. Schedule regular health, dental, and eye exams. Summary Having a healthy lifestyle and getting preventive care can help to protect your health and wellness after age 4. Screening and testing are the best way to find a health problem early and help you avoid having a fall. Early diagnosis and treatment give you the best chance for managing medical conditions that are more common for people who are older than age 89. Falls are a major cause of broken bones and head injuries in people who are older than age 52. Take precautions to prevent a fall at home. Work with your health care provider to learn what changes you can make to improve your health and wellness and to prevent falls. This information is not intended to replace advice given to you by your health care provider. Make sure you discuss any questions you have with your health care provider. Document Revised: 09/19/2020 Document Reviewed: 09/19/2020 Elsevier Patient Education  [place], MD [facility]

## 2021-11-01 NOTE — Assessment & Plan Note (Signed)
No medications.  Diet controlled.  Stable.

## 2021-11-03 ENCOUNTER — Ambulatory Visit
Admission: RE | Admit: 2021-11-03 | Discharge: 2021-11-03 | Disposition: A | Discharge status: Home / Self Care | Payer: Medicare Other | Source: Ambulatory Visit | Attending: Cardiology | Admitting: Cardiology

## 2021-11-03 DIAGNOSIS — I4891 Unspecified atrial fibrillation: Secondary | ICD-10-CM

## 2021-11-03 DIAGNOSIS — I472 Ventricular tachycardia, unspecified: Secondary | ICD-10-CM

## 2021-11-03 DIAGNOSIS — Z95810 Presence of automatic (implantable) cardiac defibrillator: Secondary | ICD-10-CM

## 2021-11-03 DIAGNOSIS — Z952 Presence of prosthetic heart valve: Secondary | ICD-10-CM

## 2021-11-03 DIAGNOSIS — I493 Ventricular premature depolarization: Secondary | ICD-10-CM

## 2021-11-03 DIAGNOSIS — I10 Essential (primary) hypertension: Secondary | ICD-10-CM

## 2021-11-03 DIAGNOSIS — I5032 Chronic diastolic (congestive) heart failure: Secondary | ICD-10-CM

## 2021-11-11 ENCOUNTER — Encounter: Payer: Self-pay | Admitting: Cardiology

## 2021-11-15 NOTE — Addendum Note (Signed)
Addended by: Darrell Jewel on: 11/15/2021 08:59 AM   Modules accepted: Orders

## 2021-11-23 ENCOUNTER — Other Ambulatory Visit (INDEPENDENT_AMBULATORY_CARE_PROVIDER_SITE_OTHER): Payer: Medicare Other

## 2021-11-23 ENCOUNTER — Encounter: Payer: Self-pay | Admitting: Gastroenterology

## 2021-11-23 ENCOUNTER — Ambulatory Visit (INDEPENDENT_AMBULATORY_CARE_PROVIDER_SITE_OTHER): Payer: Medicare Other | Admitting: Gastroenterology

## 2021-11-23 VITALS — BP 106/68 | HR 67 | Ht 73.0 in | Wt 186.0 lb

## 2021-11-23 DIAGNOSIS — I4891 Unspecified atrial fibrillation: Secondary | ICD-10-CM

## 2021-11-23 DIAGNOSIS — K50919 Crohn's disease, unspecified, with unspecified complications: Secondary | ICD-10-CM | POA: Diagnosis not present

## 2021-11-23 LAB — COMPREHENSIVE METABOLIC PANEL
ALT: 17 U/L (ref 0–53)
AST: 20 U/L (ref 0–37)
Albumin: 3.9 g/dL (ref 3.5–5.2)
Alkaline Phosphatase: 70 U/L (ref 39–117)
BUN: 16 mg/dL (ref 6–23)
CO2: 30 mEq/L (ref 19–32)
Calcium: 8.9 mg/dL (ref 8.4–10.5)
Chloride: 102 mEq/L (ref 96–112)
Creatinine, Ser: 1.13 mg/dL (ref 0.40–1.50)
GFR: 62.22 mL/min (ref >60.00)
Glucose, Bld: 101 mg/dL — ABNORMAL HIGH (ref 70–99)
Potassium: 4.3 mEq/L (ref 3.5–5.1)
Sodium: 141 mEq/L (ref 135–145)
Total Bilirubin: 0.4 mg/dL (ref 0.2–1.2)
Total Protein: 6.5 g/dL (ref 6.0–8.3)

## 2021-11-23 LAB — C-REACTIVE PROTEIN: CRP: <1 mg/dL (ref 0.5–20.0)

## 2021-11-23 LAB — CBC WITH DIFFERENTIAL/PLATELET
Basophils Absolute: 0.1 10*3/uL (ref 0.0–0.1)
Basophils Relative: 1 % (ref 0.0–3.0)
Eosinophils Absolute: 0.4 10*3/uL (ref 0.0–0.7)
Eosinophils Relative: 5.8 % — ABNORMAL HIGH (ref 0.0–5.0)
HCT: 41.6 % (ref 39.0–52.0)
Hemoglobin: 13.5 g/dL (ref 13.0–17.0)
Lymphocytes Relative: 21.4 % (ref 12.0–46.0)
Lymphs Abs: 1.5 10*3/uL (ref 0.7–4.0)
MCHC: 32.4 g/dL (ref 30.0–36.0)
MCV: 84.9 fl (ref 78.0–100.0)
Monocytes Absolute: 0.6 10*3/uL (ref 0.1–1.0)
Monocytes Relative: 8.7 % (ref 3.0–12.0)
Neutro Abs: 4.5 10*3/uL (ref 1.4–7.7)
Neutrophils Relative %: 63.1 % (ref 43.0–77.0)
Platelets: 214 10*3/uL (ref 150.0–400.0)
RBC: 4.9 Mil/uL (ref 4.22–5.81)
RDW: 15.9 % — ABNORMAL HIGH (ref 11.5–15.5)
WBC: 7.2 10*3/uL (ref 4.0–10.5)

## 2021-11-23 LAB — MAGNESIUM: Magnesium: 2.1 mg/dL (ref 1.5–2.5)

## 2021-11-23 NOTE — Progress Notes (Signed)
Chief Complaint:   Referring Provider:  Dionne Ano,*      ASSESSMENT AND PLAN;   #1. 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  Remotely failed Remicade (> 20 yrs ago).  No Crohn's meds since.  #2. Multiple comorbidities including A Fib on eliquis/plavix, H/O malignant VT s/p ICD, dCHF, OSA, HTN, Diet controlled DM, AVR x 2 (2nd was TAVR), H/O melanoma, OA, SZ, anxiety/depression, H/O remote AVMs, S/P ileocecal resection, open cholecystectomy.  Plan: -CBC, CMP, CRP, Mg -Stool studies for GI Pathogen (includes C. Diff) and Calprotectin) -CTE -Prev GI Records -Can continue to use imodium on as needed basis -If still with problems, recommend colon off AC. He wants to hold off on colonoscopy.  I do agree.  He is also undergoing cardiac work-up. -FU in 12 weeks. If active Crohn's, would consider Entyvio.  If he has quiescent Crohn's, would consider cholestyramine/Lomotil.    HPI:    Billy Beck. is a 81 y.o. male  With A Fib on eliquis/plavix, H/O malignant VT s/p ICD, dCHF, OSA, HTN, Diet controlled DM, AVR x 2 (2nd was TAVR), H/O melanoma, OA, SZ, anxiety/depression, H/O remote AVMs, S/P ileocecal resection, open cholecystectomy  Here to get established  H/O crohn's ileitis s/p ileocecal resection, failed Remicade over 20 yrs ago, no meds since.  Had active disease on colon 06/2016.  Was offered Entyvio 11/2017 in [place], [place].  At that time patient declined d/t potential liver toxicity and H/O melanoma.  At baseline Diarrhea 6-8/day Mostly after eating No blood Occ but not always nocturnal symptoms Mild lower abdominal discomfort which does get better with BMs. No nausea or vomiting.  No abdominal distention. Denies having any recent weight loss. Has to take magnesium supplement since he has history of hypomagnesemia  No heartburn, regurgitation, odynophagia or dysphagia.  No significant constipation.  No melena or  hematochezia. No unintentional weight loss.  He does give history of melanoma-mostly in back/chest s/p removal x4 Also history of jaundice in 1950s. ?  Exposure to agent orange while in [place].  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 Bx-hyperplastic polyps  Colon 06/28/16 by Dr. Mare Ferrari revealed moderate number of ulcers at the ileum and mild stenosis. 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 Pathology revealed chronic and acute changes. Consider rpt 3 yrs.  H/O colon polyps in remote past  EGD 06/28/16 by Dr. Billy Coast revealed mild gastritis and small hiatal hernia   Adm 11/2017 with melena: Admitted to [facility] 11/2017 with GI bleeding. Hb 8.9 s/p 1 unit PRBC, negative EGD.  Thought to be due to Crohn's ileitis.  Plan was to follow-up for hepatitis B and TB Gold testing followed by Entyvio.   Past Medical History:  Diagnosis Date   Anxiety    Depression    Diabetes mellitus without complication [facility])     Past Surgical History:  Procedure Laterality Date   ABLATION     AORTIC VALVE REPLACEMENT     COLONOSCOPY WITH ESOPHAGOGASTRODUODENOSCOPY (EGD)  06/28/2016   [facility]   ESOPHAGOGASTRODUODENOSCOPY  12/04/2017   [facility].   PACEMAKER IMPLANT      Family History  Problem Relation Age of Onset   Cancer Mother        metastatic   Breast cancer Father    Testicular cancer Father    Lymphoma Father    Colon cancer Neg Hx    Rectal cancer Neg Hx  Stomach cancer Neg Hx     Social History   Tobacco Use   Smoking status: Former    Types: Cigarettes   Smokeless tobacco: Never  Vaping Use   Vaping Use: Never used  Substance Use Topics   Alcohol use: Not Currently   Drug use: Never    Current Outpatient Medications  Medication Sig Dispense Refill   acetaminophen (TYLENOL) 650 MG CR tablet Take 650 mg by mouth every 8 (eight) hours as needed for pain.     albuterol (VENTOLIN HFA) 108  (90 Base) MCG/ACT inhaler Inhale 1 puff into the lungs every 6 (six) hours as needed for wheezing.     clopidogrel (PLAVIX) 75 MG tablet Take 75 mg by mouth daily.     cyclobenzaprine (FLEXERIL) 5 MG tablet Take 5 mg by mouth 3 (three) times daily as needed.     ELIQUIS 5 MG TABS tablet Take 5 mg by mouth 2 (two) times daily.     famotidine (PEPCID) 20 MG tablet Take by mouth.     furosemide (LASIX) 20 MG tablet Take 20 mg by mouth daily.     hydroxyprophyl methylcellulose / hypromellose (ISOPTO TEARS) 0.5 % opthalmic solution 1 drop.     isosorbide mononitrate (IMDUR) 60 MG 24 hr tablet Take 1 tablet (60 mg total) by mouth 2 (two) times daily. 180 tablet 3   Lacosamide (VIMPAT) 150 MG TABS Take 150 mg by mouth 2 (two) times daily.     LORazepam (ATIVAN) 1 MG tablet Take 0.5 tablets (0.5 mg total) by mouth 3 (three) times daily as needed for anxiety. 15 tablet 0   magnesium oxide (MAG-OX) 400 MG tablet Take by mouth.     sertraline (ZOLOFT) 100 MG tablet Take 100 mg by mouth 2 (two) times daily.     triamcinolone cream (KENALOG) 0.1 %      No current facility-administered medications for this visit.    Allergies  Allergen Reactions   Codeine Other (See Comments)    Other reaction(s): Other - See Comments Chest pain  Chest pain  Chest pressure.  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  HEENT: Denies photophobia, eye pain, redness.  He has hearing problems. Respiratory: Denies DOE, cough, chest tightness,  and wheezing.  Occasional shortness of breath Cardiovascular: Denies chest pain, palpitations and has leg swelling.  Genitourinary: Denies dysuria, urgency, frequency, hematuria, flank pain and difficulty urinating.  Musculoskeletal: Denies myalgias, back pain, joint swelling, arthralgias and gait problem.  Has arthritis. Skin: No rash.  Neurological: Denies dizziness, seizures, syncope, weakness, light-headedness, numbness and headaches.  Hematological: Denies adenopathy. Easy bruising, personal or family  bleeding history  Psychiatric/Behavioral: Has anxiety or depression     Physical Exam:    BP 106/68   Pulse 67   Ht '6\' 1"'$  (1.854 m)   Wt 186 lb (84.4 kg)   SpO2 97%   BMI 24.54 kg/m  Wt Readings from Last 3 Encounters:  11/23/21 186 lb (84.4 kg)  11/01/21 188 lb 2 oz (85.3 kg)  10/31/21 189 lb (85.7 kg)   Constitutional:  Well-developed, in no acute distress. Psychiatric: Normal mood and affect. Behavior is normal. HEENT: Pupils normal.  Conjunctivae are normal. No scleral icterus. Neck supple.  Cardiovascular: Normal rate, regular rhythm. No edema Pulmonary/chest: Effort normal and breath sounds normal. No wheezing, rales or rhonchi. Abdominal: Soft, nondistended. Nontender. Bowel sounds active throughout. There are no masses palpable. No hepatomegaly.  Well-healed surgical midline abdominal scar Rectal: Deferred Neurological: Alert and oriented to person place and time. Skin: Skin is warm and dry. No rashes noted.  Data Reviewed: I have personally reviewed following labs and imaging studies  CBC:    Latest Ref Rng & Units 05/11/2021    4:50 PM  CBC  WBC 4.0  - 10.5 K/uL 5.6   Hemoglobin 13.0 - 17.0 g/dL 12.3   Hematocrit 39.0 - 52.0 % 40.2   Platelets 150 - 400 K/uL 210     CMP:    Latest Ref Rng & Units 05/11/2021    4:50 PM  CMP  Glucose 70 - 99 mg/dL 102   BUN 8 - 23 mg/dL 9   Creatinine 0.61 - 1.24 mg/dL 1.04   Sodium 135 - 145 mmol/L 137   Potassium 3.5 - 5.1 mmol/L 3.5   Chloride 98 - 111 mmol/L 104   CO2 22 - 32 mmol/L 27   Calcium 8.9 - 10.3 mg/dL 8.6       Carmell Austria, MD 11/23/2021, 9:19 AM  Cc: Molter, Arletta Bale,*

## 2021-11-23 NOTE — Patient Instructions (Addendum)
If you are age 79 or older, your body mass index should be between 23-30. Your Body mass index is 24.54 kg/m. If this is out of the aforementioned range listed, please consider follow up with your Primary Care Provider.  If you are age 89 or younger, your body mass index should be between 19-25. Your Body mass index is 24.54 kg/m. If this is out of the aformentioned range listed, please consider follow up with your Primary Care Provider.   ________________________________________________________  The [place] [facility] providers would like to encourage you to use [facility] to communicate with providers for non-urgent requests or questions.  Due to long hold times on the telephone, sending your provider a message by [facility] may be a faster and more efficient way to get a response.  Please allow 48 business hours for a response.  Please remember that this is for non-urgent requests.  _______________________________________________________  Your provider has requested that you go to the basement level for lab work before leaving today. Press "B" on the elevator. The lab is located at the first door on the left as you exit the elevator.  Call in 3 months to schedule an office visit  You have been scheduled for a CT [facility] [place], [place] [address]).   You are scheduled on 12-01-2021  at 3pm. You should arrive 1 hour 30 minutes prior to your appointment time for registration. Arrive by 1:30pm Please follow the written instructions below on the day of your exam:  WARNING: IF YOU ARE ALLERGIC TO IODINE/X-RAY DYE, PLEASE NOTIFY RADIOLOGY IMMEDIATELY AT [phone]! YOU WILL BE GIVEN A 13 HOUR PREMEDICATION PREP.  1) Do not eat or drink anything after 11am (4 hours prior to your test)  You may take any medications as prescribed with a small amount of water, if necessary. If you take any of the following medications: METFORMIN, GLUCOPHAGE,  GLUCOVANCE, AVANDAMET, RIOMET, FORTAMET, [place] MET, JANUMET, GLUMETZA or METAGLIP, you MAY be asked to HOLD this medication 48 hours AFTER the exam.  The purpose of you drinking the oral contrast is to aid in the visualization of your intestinal tract. The contrast solution may cause some diarrhea. Depending on your individual set of symptoms, you may also receive an intravenous injection of x-ray contrast/dye. Plan on being at [facility] for 30 minutes or longer, depending on the type of exam you are having performed.  If you have any questions regarding your exam or if you need to reschedule, you may call the CT department at [phone] between the hours of 8:00 am and 5:00 pm, Monday-Friday.  ________________________________________________________________________   Thank you,  Dr. Jackquline Denmark

## 2021-11-24 ENCOUNTER — Encounter: Payer: Self-pay | Admitting: Cardiology

## 2021-11-29 ENCOUNTER — Other Ambulatory Visit: Payer: Medicare Other

## 2021-11-29 DIAGNOSIS — K50919 Crohn's disease, unspecified, with unspecified complications: Secondary | ICD-10-CM

## 2021-12-01 ENCOUNTER — Ambulatory Visit (HOSPITAL_COMMUNITY)
Admission: RE | Admit: 2021-12-01 | Discharge: 2021-12-01 | Disposition: A | Discharge status: Home / Self Care | Payer: Medicare Other | Source: Ambulatory Visit | Attending: Gastroenterology | Admitting: Gastroenterology

## 2021-12-01 DIAGNOSIS — K50919 Crohn's disease, unspecified, with unspecified complications: Secondary | ICD-10-CM | POA: Diagnosis present

## 2021-12-01 LAB — CALPROTECTIN, FECAL: Calprotectin, Fecal: 261 ug/g — ABNORMAL HIGH (ref 0–120)

## 2021-12-01 MED ORDER — IOHEXOL 300 MG/ML  SOLN
100.0000 mL | Freq: Once | INTRAMUSCULAR | Status: AC | PRN
Start: 2021-12-01 — End: 2021-12-01
  Administered 2021-12-01: 100 mL via INTRAVENOUS

## 2021-12-01 MED ORDER — BARIUM SULFATE 0.1 % PO SUSP
450.0000 mL | Freq: Once | ORAL | Status: AC
  Administered 2021-12-01: 1350 mL via ORAL

## 2021-12-02 LAB — GI PROFILE, STOOL, PCR

## 2021-12-04 ENCOUNTER — Other Ambulatory Visit: Payer: Self-pay

## 2021-12-04 DIAGNOSIS — K50919 Crohn's disease, unspecified, with unspecified complications: Secondary | ICD-10-CM

## 2021-12-04 MED ORDER — CHOLESTYRAMINE 4 G PO PACK: 4.0000 g | PACK | Freq: Every day | ORAL | 3 refills | Status: AC

## 2021-12-07 ENCOUNTER — Ambulatory Visit (INDEPENDENT_AMBULATORY_CARE_PROVIDER_SITE_OTHER): Payer: Medicare Other | Admitting: Cardiology

## 2021-12-07 ENCOUNTER — Encounter: Payer: Self-pay | Admitting: Cardiology

## 2021-12-07 VITALS — BP 130/78 | HR 68 | Ht 73.0 in | Wt 188.0 lb

## 2021-12-07 DIAGNOSIS — I472 Ventricular tachycardia, unspecified: Secondary | ICD-10-CM

## 2021-12-07 DIAGNOSIS — Z95810 Presence of automatic (implantable) cardiac defibrillator: Secondary | ICD-10-CM

## 2021-12-07 DIAGNOSIS — I5032 Chronic diastolic (congestive) heart failure: Secondary | ICD-10-CM | POA: Diagnosis not present

## 2021-12-07 DIAGNOSIS — I10 Essential (primary) hypertension: Secondary | ICD-10-CM | POA: Diagnosis not present

## 2021-12-07 DIAGNOSIS — I480 Paroxysmal atrial fibrillation: Secondary | ICD-10-CM | POA: Diagnosis not present

## 2021-12-07 NOTE — Progress Notes (Signed)
Electrophysiology Office Follow up Visit Note:    Date:  12/07/2021   ID:  Billy Beck., DOB 1940-06-18, MRN [id number]  PCP:  Horald Pollen, MD  [facility] Cardiologist:  None  [facility] Electrophysiologist:  Vickie Epley, MD    Interval History:    Billy Beck. is a 81 y.o. male who presents for a follow up visit of their ICD MDT. They were last seen in clinic 10/31/2021.  Since their last appointment, he messaged the office 11/11/21 reporting sharp chest pains around the region of his pacemaker.  Today,   They deny any palpitations, chest pain, shortness of breath, or peripheral edema. No lightheadedness, headaches, syncope, orthopnea, or PND.  Today he tells me he has had some sharp intermittent pains around his pacemaker site.  The on the lateral aspect of the pacemaker and come and go within seconds.  Sometimes pushing on the area can elicit a response of pain.     Past Medical History:  Diagnosis Date   Anxiety    Depression    Diabetes mellitus without complication [facility])     Past Surgical History:  Procedure Laterality Date   ABLATION     AORTIC VALVE REPLACEMENT     COLONOSCOPY WITH ESOPHAGOGASTRODUODENOSCOPY (EGD)  06/28/2016   [facility]   ESOPHAGOGASTRODUODENOSCOPY  12/04/2017   [facility].   PACEMAKER IMPLANT      Current Medications: Current Meds  Medication Sig   acetaminophen (TYLENOL) 650 MG CR tablet Take 650 mg by mouth every 8 (eight) hours as needed for pain.   albuterol (VENTOLIN HFA) 108 (90 Base) MCG/ACT inhaler Inhale 1 puff into the lungs every 6 (six) hours as needed for wheezing.   cholestyramine (QUESTRAN) 4 g packet Take 1 packet (4 g total) by mouth daily. Take 2 hours before or after all other medications   clopidogrel (PLAVIX) 75 MG tablet Take 75 mg by mouth daily.   cyclobenzaprine (FLEXERIL) 5 MG tablet Take 5 mg by mouth 3 (three) times daily as needed.   ELIQUIS 5 MG TABS tablet Take 5  mg by mouth 2 (two) times daily.   famotidine (PEPCID) 20 MG tablet Take by mouth.   furosemide (LASIX) 20 MG tablet Take 20 mg by mouth daily.   hydroxyprophyl methylcellulose / hypromellose (ISOPTO TEARS) 0.5 % opthalmic solution 1 drop.   isosorbide mononitrate (IMDUR) 60 MG 24 hr tablet Take 1 tablet (60 mg total) by mouth 2 (two) times daily.   Lacosamide (VIMPAT) 150 MG TABS Take 150 mg by mouth 2 (two) times daily.   LORazepam (ATIVAN) 1 MG tablet Take 0.5 tablets (0.5 mg total) by mouth 3 (three) times daily as needed for anxiety.   magnesium oxide (MAG-OX) 400 MG tablet Take by mouth.   sertraline (ZOLOFT) 100 MG tablet Take 100 mg by mouth 2 (two) times daily.   triamcinolone cream (KENALOG) 0.1 %      Allergies:   Codeine, Fluoxetine, Tape, Gabapentin, Morphine, and Simvastatin   Social History   Socioeconomic History   Marital status: Widowed    Spouse name: Not on file   Number of children: 2   Years of education: Not on file   Highest education level: Not on file  Occupational History   Occupation: retired  Tobacco Use   Smoking status: Former    Types: Cigarettes   Smokeless tobacco: Never  Vaping Use   Vaping Use: Never used  Substance and Sexual Activity  Alcohol use: Not Currently   Drug use: Never   Sexual activity: Not on file  Other Topics Concern   Not on file  Social History Narrative   Not on file   Social Determinants of Health   Financial Resource Strain: Not on file  Food Insecurity: Not on file  Transportation Needs: Not on file  Physical Activity: Not on file  Stress: Not on file  Social Connections: Not on file     Family History: The patient's family history includes Breast cancer in his father; Cancer in his mother; Lymphoma in his father; Testicular cancer in his father. There is no history of Colon cancer, Rectal cancer, or Stomach cancer.  ROS:   Please see the history of present illness.     All other systems reviewed and are  negative.  EKGs/Labs/Other Studies Reviewed:    The following studies were reviewed today:  12/07/2021  In clinic device interrogation personally reviewed: Presenting programming VVIR Atrial lead is capturing appropriately and conduction of the ventricle with a long stem to the time Was able to program the device to minimize ventricular pacing with a PR of around 340 ms Final parameters DDD 70   10/31/2021  In clinic device interrogation personally reviewed: Implant date January 06, 2020 Battery longevity 4.9 to 5.2 years Atrial lead without clear evidence of capture.  Sensing is poor as well at 0.2 mV.  Impedance is 380 ohms on the atrial lead.  Ventricular capture threshold 1.0 at 0.4 with sensing of 10.4 mV and an impedance of 340 ohms.  On atrial lead sensing, EGM reveals ventricular EGM concerning for a malpositioned atrial lead.     09/12/2021  Clinic Pacemaker Interrogation  ([employer]): Personally reviewed.   06/22/2021  Nuclear Stress test  [facility]):   Resting ECG: indicated a paced rhythm.    Stress ECG: non diagnostic.    Protocol: Pharmacologic using regadenoson    Hemodynamic Response: adequate.    Symptoms: shortness of breath.    Left ventricle cavity size is normal.    LV Function: EF is 63%. LV function is normal. The wall motion is  normal.    Left ventricular perfusion: normal.    Low risk study   - At rest, there is an infero-apical defect and a basal-to-mid  inferolateral defect that is likely due to large amount of obvious  adjacent bowel uptake.  - Post-regadenoson:  Infero-apical defect persists, although improves, this is consistent with  small inferoapical infarction pattern versus apical thinning artifact, and  is low risk.  The prior basal-to-mid inferolateral defect improves to normal, consistent  with bowel attenuation artifact.    06/21/2021  Echo  ([place]):   Technically difficult study.    The left ventricular systolic function is  normal (55-65%).    Unable to assess left ventricular diastolic function (paced rhythm).    The right ventricular systolic function is normal.    The left atrium is severely dilated.    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 The prosthetic valve  function is normal.    Mild mitral valve regurgitation.   EKG:  EKG is personally reviewed.  12/07/2021: Programming the patient AAI 70 allows for an intrinsic conduction with prolonged stim to be time 10/31/2021: No discernible atrial activity or atrial capture with high output atrial pacing.  Ventricular pacing with intermittent PVCs.  Recent Labs: 11/23/2021: ALT 17; BUN 16; Creatinine, Ser 1.13; Hemoglobin 13.5; Magnesium 2.1; Platelets 214.0; Potassium 4.3; Sodium 141  Recent Lipid Panel No results found for: "CHOL", "TRIG", "HDL", "CHOLHDL", "VLDL", "LDLCALC", "LDLDIRECT"  Physical Exam:    VS:  BP 130/78   Pulse 68   Ht '6\' 1"'$  (1.854 m)   Wt 188 lb (85.3 kg)   BMI 24.80 kg/m     Wt Readings from Last 3 Encounters:  12/07/21 188 lb (85.3 kg)  11/23/21 186 lb (84.4 kg)  11/01/21 188 lb 2 oz (85.3 kg)     GEN: Well nourished, well developed in no acute distress HEENT: Normal NECK: No JVD; No carotid bruits LYMPHATICS: No lymphadenopathy CARDIAC: RRR, no murmurs, rubs, gallops; Device pocket well healed. RESPIRATORY:  Clear to auscultation without rales, wheezing or rhonchi  ABDOMEN: Soft, non-tender, non-distended MUSCULOSKELETAL:  No edema; No deformity  SKIN: Warm and dry NEUROLOGIC:  Alert and oriented x 3 PSYCHIATRIC:  Normal affect        ASSESSMENT:    1. Paroxysmal A-fib ([place])   2. Primary hypertension   3. Chronic diastolic heart failure ([place])   4. VT (ventricular tachycardia) ([place])   5. ICD (implantable cardioverter-defibrillator) in place    PLAN:    In order of problems listed above:  #Paroxysmal atrial fibrillation Low burden On Eliquis for stroke prophylaxis Continue remote monitoring of  his device to monitor burden of atrial fibrillation  #Hypertension Controlled Continue current medication regimen  #Chronic diastolic heart failure Euvolemic Continue current medication regimen including Lasix  #Ventricular tachycardia #ICD in situ Device functioning appropriately Reprogrammed device to minimize ventricular pacing and maximize battery longevity.  Continue remote monitoring.  Follow-up 1 year or sooner as needed.   Total time spent with patient today 45 minutes. This includes reviewing records, evaluating the patient and coordinating care.   Medication Adjustments/Labs and Tests Ordered: Current medicines are reviewed at length with the patient today.  Concerns regarding medicines are outlined above.  Orders Placed This Encounter  Procedures   EKG 12-Lead   No orders of the defined types were placed in this encounter.   I,Mathew Stumpf,acting as a Education administrator for Vickie Epley, MD.,have documented all relevant documentation on the behalf of Vickie Epley, MD,as directed by  Vickie Epley, MD while in the presence of Vickie Epley, MD.  I, Vickie Epley, MD, have reviewed all documentation for this visit. The documentation on 12/07/21 for the exam, diagnosis, procedures, and orders are all accurate and complete.   Signed, Lars Mage, MD, [facility], [facility] 12/07/2021 5:21 PM    Electrophysiology [facility]

## 2021-12-07 NOTE — Patient Instructions (Signed)
Medication Instructions:  Your physician recommends that you continue on your current medications as directed. Please refer to the Current Medication list given to you today. *If you need a refill on your cardiac medications before your next appointment, please call your pharmacy*  Lab Work: None. If you have labs (blood work) drawn today and your tests are completely normal, you will receive your results only by: [place] (if you have MyChart) OR A paper copy in the mail If you have any lab test that is abnormal or we need to change your treatment, we will call you to review the results.  Testing/Procedures: None.  Follow-Up: At [facility], you and your health needs are our priority.  As part of our continuing mission to provide you with exceptional heart care, we have created designated Provider Care Teams.  These Care Teams include your primary Cardiologist (physician) and Advanced Practice Providers (APPs -  Physician Assistants and Nurse Practitioners) who all work together to provide you with the care you need, when you need it.  Your physician wants you to follow-up in: 12 months with Lars Mage, MD     You will receive a reminder letter in the mail two months in advance. If you don't receive a letter, please call our office to schedule the follow-up appointment.  We recommend signing up for the patient portal called "MyChart".  Sign up information is provided on this After Visit Summary.  MyChart is used to connect with patients for Virtual Visits (Telemedicine).  Patients are able to view lab/test results, encounter notes, upcoming appointments, etc.  Non-urgent messages can be sent to your provider as well.   To learn more about what you can do with MyChart, go to [email or web address].    Any Other Special Instructions Will Be Listed Below (If Applicable).

## 2021-12-22 ENCOUNTER — Telehealth: Payer: Self-pay

## 2021-12-22 NOTE — Telephone Encounter (Signed)
OK with this TOC. No problem with me.

## 2021-12-22 NOTE — Telephone Encounter (Signed)
Rip Harbour is calling on behalf of pt requesting to Transfer care from Dr. Mitchel Honour to Dr. Alain Marion.   Rip Harbour stated that the pt didn't feel like it was a good fit and that he doesn't wish to see Dr. Mitchel Honour.  I advised Rip Harbour that upon approval from both providers that I would call back to schedule TOC appt.  Please advise

## 2021-12-25 NOTE — Telephone Encounter (Signed)
I left a VM for Melinda to call the office back.

## 2021-12-25 NOTE — Telephone Encounter (Signed)
Unfortunately, I'm not able to accept any more new patients at this time.  I'm sorry! Thank you!  

## 2022-01-01 ENCOUNTER — Telehealth: Payer: Self-pay | Admitting: Gastroenterology

## 2022-01-01 NOTE — Telephone Encounter (Signed)
Spoke with pt. Documented under results notes:

## 2022-01-01 NOTE — Telephone Encounter (Signed)
Patient is returning your call.  

## 2022-01-08 ENCOUNTER — Telehealth: Payer: Self-pay | Admitting: Cardiology

## 2022-01-08 NOTE — Telephone Encounter (Signed)
Patient calling to follow up on handicap parking he requested about 3 weeks ago.

## 2022-01-08 NOTE — Telephone Encounter (Signed)
Placed at front desk for pick up, patient aware.

## 2022-01-20 ENCOUNTER — Other Ambulatory Visit: Payer: Self-pay

## 2022-01-20 ENCOUNTER — Ambulatory Visit
Admission: EM | Admit: 2022-01-20 | Discharge: 2022-01-20 | Disposition: A | Discharge status: Home / Self Care | Payer: Medicare Other | Attending: Internal Medicine | Admitting: Internal Medicine

## 2022-01-20 DIAGNOSIS — R0602 Shortness of breath: Secondary | ICD-10-CM | POA: Diagnosis not present

## 2022-01-20 DIAGNOSIS — R051 Acute cough: Secondary | ICD-10-CM

## 2022-01-20 DIAGNOSIS — R0789 Other chest pain: Secondary | ICD-10-CM

## 2022-01-20 HISTORY — DX: Malignant (primary) neoplasm, unspecified: C80.1

## 2022-01-20 HISTORY — DX: Nonrheumatic aortic valve disorder, unspecified: I35.9

## 2022-01-20 HISTORY — DX: Unspecified convulsions: R56.9

## 2022-01-20 NOTE — Discharge Instructions (Signed)
Go to the emergency department as soon as you leave urgent care for further evaluation and management. 

## 2022-01-20 NOTE — ED Provider Notes (Signed)
[facility]    CSN: [id number] Arrival date & time: 01/20/22  1246      History   Chief Complaint Chief Complaint  Patient presents with   Chest Pain    HPI Billy Coppa. is a 81 y.o. male.   Patient presents with nasal congestion, cough, chest pain, shortness of breath that started about 3 days ago.  Patient reports the chest pain is present in the center of the chest and radiates "straight through chest".  Also reports some intermittent shortness of breath.  Denies any known fevers.  His family member who he lives with has had similar symptoms.  Patient does have a significant cardiac history including atrial fibrillation, ventricular tachycardia, CHF, and has pacemaker/defibrillator in place.   Chest Pain   Past Medical History:  Diagnosis Date   Anxiety    Aortic valve defect    Cancer ([place])    Depression    Diabetes mellitus without complication ([place])    Seizures ([place])     Patient Active Problem List   Diagnosis Date Noted   Diet-controlled diabetes mellitus ([place]) 11/01/2021   Paroxysmal A-fib ([place]) 11/01/2021   Crohn's disease with complication ([place]) 88/50/2774   History of melanoma 11/01/2021   Primary osteoarthritis involving multiple joints 11/01/2021   ICD (implantable cardioverter-defibrillator) in place 11/01/2021   Primary hypertension 11/01/2021   Chronic diastolic heart failure ([place]) 11/01/2021    Past Surgical History:  Procedure Laterality Date   ABLATION     AORTIC VALVE REPLACEMENT     COLONOSCOPY WITH ESOPHAGOGASTRODUODENOSCOPY (EGD)  06/28/2016   [facility]   ESOPHAGOGASTRODUODENOSCOPY  12/04/2017   [facility].   PACEMAKER IMPLANT         Home Medications    Prior to Admission medications   Medication Sig Start Date End Date Taking? Authorizing Provider  acetaminophen (TYLENOL) 650 MG CR tablet Take 650 mg by mouth every 8 (eight) hours as needed for pain.    [provider]  albuterol (VENTOLIN  HFA) 108 (90 Base) MCG/ACT inhaler Inhale 1 puff into the lungs every 6 (six) hours as needed for wheezing. 09/11/16   [provider]  cholestyramine (QUESTRAN) 4 g packet Take 1 packet (4 g total) by mouth daily. Take 2 hours before or after all other medications 12/04/21   Jackquline Denmark, MD  clopidogrel (PLAVIX) 75 MG tablet Take 75 mg by mouth daily. 05/08/21   [provider]  cyclobenzaprine (FLEXERIL) 5 MG tablet Take 5 mg by mouth 3 (three) times daily as needed. 06/14/21   [provider]  ELIQUIS 5 MG TABS tablet Take 5 mg by mouth 2 (two) times daily. 04/10/21   [provider]  famotidine (PEPCID) 20 MG tablet Take by mouth. 01/08/17   [provider]  furosemide (LASIX) 20 MG tablet Take 20 mg by mouth daily. 05/08/21   [provider]  hydroxyprophyl methylcellulose / hypromellose (ISOPTO TEARS) 0.5 % opthalmic solution 1 drop.    [provider]  isosorbide mononitrate (IMDUR) 60 MG 24 hr tablet Take 1 tablet (60 mg total) by mouth 2 (two) times daily. 10/31/21   Vickie Epley, MD  Lacosamide (VIMPAT) 150 MG TABS Take 150 mg by mouth 2 (two) times daily.    [provider]  LORazepam (ATIVAN) 1 MG tablet Take 0.5 tablets (0.5 mg total) by mouth 3 (three) times daily as needed for anxiety. 05/12/21   Mesner, Corene Cornea, MD  magnesium oxide (MAG-OX) 400 MG  tablet Take by mouth. 07/24/19   [provider]  sertraline (ZOLOFT) 100 MG tablet Take 100 mg by mouth 2 (two) times daily. 01/08/21   [provider]  triamcinolone cream (KENALOG) 0.1 %  04/12/21   [provider]    Family History Family History  Problem Relation Age of Onset   Cancer Mother        metastatic   Breast cancer Father    Testicular cancer Father    Lymphoma Father    Colon cancer Neg Hx    Rectal cancer Neg Hx    Stomach cancer Neg Hx     Social History Social History   Tobacco Use   Smoking status: Former     Types: Cigarettes   Smokeless tobacco: Never  Vaping Use   Vaping Use: Never used  Substance Use Topics   Alcohol use: Not Currently   Drug use: Never     Allergies   Codeine, Fluoxetine, Tape, Gabapentin, Morphine, and Simvastatin   Review of Systems Review of Systems Per HPI  Physical Exam Triage Vital Signs ED Triage Vitals [01/20/22 1321]  Enc Vitals Group     BP (!) 92/57     Pulse Rate 70     Resp 16     Temp 98.4 F (36.9 C)     Temp Source Oral     SpO2 95 %     Weight      Height      Head Circumference      Peak Flow      Pain Score 5     Pain Loc      Pain Edu?      Excl. in [place]?    No data found.  Updated Vital Signs BP (!) 92/57 (BP Location: Left Arm)   Pulse 70   Temp 98.4 F (36.9 C) (Oral)   Resp 16   SpO2 95%   Visual Acuity Right Eye Distance:   Left Eye Distance:   Bilateral Distance:    Right Eye Near:   Left Eye Near:    Bilateral Near:     Physical Exam Constitutional:      General: He is not in acute distress.    Appearance: Normal appearance. He is not toxic-appearing or diaphoretic.  HENT:     Head: Normocephalic and atraumatic.     Right Ear: Tympanic membrane and ear canal normal.     Left Ear: Tympanic membrane and ear canal normal.     Nose: Congestion present.     Mouth/Throat:     Mouth: Mucous membranes are moist.     Pharynx: No posterior oropharyngeal erythema.  Eyes:     Extraocular Movements: Extraocular movements intact.     Conjunctiva/sclera: Conjunctivae normal.     Pupils: Pupils are equal, round, and reactive to light.  Cardiovascular:     Rate and Rhythm: Normal rate and regular rhythm.     Pulses: Normal pulses.     Heart sounds: Normal heart sounds.  Pulmonary:     Effort: Pulmonary effort is normal. No respiratory distress.     Breath sounds: Normal breath sounds. No stridor. No wheezing, rhonchi or rales.  Abdominal:     General: Abdomen is flat. Bowel sounds are normal.     Palpations:  Abdomen is soft.  Musculoskeletal:        General: Normal range of motion.     Cervical back: Normal range of motion.  Skin:  General: Skin is warm and dry.  Neurological:     General: No focal deficit present.     Mental Status: He is alert and oriented to person, place, and time. Mental status is at baseline.  Psychiatric:        Mood and Affect: Mood normal.        Behavior: Behavior normal.        Thought Content: Thought content normal.        Judgment: Judgment normal.      UC Treatments / Results  Labs (all labs ordered are listed, but only abnormal results are displayed) Labs Reviewed - No data to display  EKG   Radiology No results found.  Procedures Procedures (including critical care time)  Medications Ordered in UC Medications - No data to display  Initial Impression / Assessment and Plan / UC Course  I have reviewed the triage vital signs and the nursing notes.  Pertinent labs & imaging results that were available during my care of the patient were reviewed by me and considered in my medical decision making (see chart for details).     Called ro the patient's physical exam room by nursing staff given patient's blood pressure was slightly low with associated chest pain and shortness of breath.  I am highly suspicious that patient's symptoms are due to upper respiratory infection versus  concern for community-acquired pneumonia.  Although, given patient's significant cardiac history with associated chest pain, shortness of breath, mildly low blood pressure, recommended to patient that he go to the emergency department for further evaluation and management given limited resources here in urgent care.  Patient was agreeable with plan.  Suggested EMS transport but patient declined wishing for his family member to transport him.  Risk associated with not going by EMS were discussed with patient.  Patient voiced understanding.  Patient left via self transport. Final  Clinical Impressions(s) / UC Diagnoses   Final diagnoses:  Other chest pain  Acute cough  Shortness of breath     Discharge Instructions      Go to the emergency department as soon as you leave urgent care for further evaluation and management.    ED Prescriptions   None    PDMP not reviewed this encounter.   Teodora Medici, [place] 01/20/22 1346

## 2022-01-20 NOTE — ED Triage Notes (Signed)
Pt c/o cough, chest pain,   Onset ~ 3 days ago   Hx of cardiac surgeries

## 2022-02-01 ENCOUNTER — Telehealth: Payer: Self-pay | Admitting: *Deleted

## 2022-02-01 ENCOUNTER — Ambulatory Visit: Payer: Medicare Other | Admitting: Emergency Medicine

## 2022-02-01 NOTE — Telephone Encounter (Signed)
Transition Care Management Unsuccessful Follow-up Telephone Call  Date of discharge and from where:  01/27/2022 from [facility]   Attempts:  1st Attempt  Reason for unsuccessful TCM follow-up call:  Left voice message

## 2022-02-02 NOTE — Telephone Encounter (Signed)
Transition Care Management Unsuccessful Follow-up Telephone Call  Date of discharge and from where:  01/27/2022 from [facility]  Attempts:  2nd Attempt  Reason for unsuccessful TCM follow-up call:  Left voice message

## 2022-02-05 NOTE — Telephone Encounter (Signed)
TOC letter mailed to address on file

## 2022-02-05 NOTE — Telephone Encounter (Signed)
Transition Care Management Unsuccessful Follow-up Telephone Call  Date of discharge and from where:  01/27/2022 from [facility]   Attempts:  3rd Attempt  Reason for unsuccessful TCM follow-up call:  Left voice message

## 2022-02-19 ENCOUNTER — Emergency Department (HOSPITAL_COMMUNITY): Payer: Medicare Other

## 2022-02-19 ENCOUNTER — Encounter (HOSPITAL_COMMUNITY): Payer: Self-pay | Admitting: Emergency Medicine

## 2022-02-19 ENCOUNTER — Telehealth: Payer: Self-pay | Admitting: Cardiology

## 2022-02-19 ENCOUNTER — Observation Stay (HOSPITAL_COMMUNITY)
Admission: EM | Admit: 2022-02-19 | Discharge: 2022-02-22 | Disposition: A | Discharge status: Home / Self Care | Payer: Medicare Other | Attending: Cardiovascular Disease | Admitting: Cardiovascular Disease

## 2022-02-19 DIAGNOSIS — I110 Hypertensive heart disease with heart failure: Secondary | ICD-10-CM | POA: Insufficient documentation

## 2022-02-19 DIAGNOSIS — I480 Paroxysmal atrial fibrillation: Secondary | ICD-10-CM | POA: Diagnosis not present

## 2022-02-19 DIAGNOSIS — I5032 Chronic diastolic (congestive) heart failure: Secondary | ICD-10-CM | POA: Diagnosis not present

## 2022-02-19 DIAGNOSIS — Z95810 Presence of automatic (implantable) cardiac defibrillator: Secondary | ICD-10-CM | POA: Diagnosis not present

## 2022-02-19 DIAGNOSIS — Z7902 Long term (current) use of antithrombotics/antiplatelets: Secondary | ICD-10-CM | POA: Diagnosis not present

## 2022-02-19 DIAGNOSIS — R778 Other specified abnormalities of plasma proteins: Secondary | ICD-10-CM | POA: Insufficient documentation

## 2022-02-19 DIAGNOSIS — Z79899 Other long term (current) drug therapy: Secondary | ICD-10-CM | POA: Insufficient documentation

## 2022-02-19 DIAGNOSIS — R0789 Other chest pain: Secondary | ICD-10-CM | POA: Diagnosis present

## 2022-02-19 DIAGNOSIS — Z7901 Long term (current) use of anticoagulants: Secondary | ICD-10-CM | POA: Diagnosis not present

## 2022-02-19 DIAGNOSIS — I472 Ventricular tachycardia, unspecified: Secondary | ICD-10-CM | POA: Insufficient documentation

## 2022-02-19 DIAGNOSIS — Z87891 Personal history of nicotine dependence: Secondary | ICD-10-CM | POA: Insufficient documentation

## 2022-02-19 DIAGNOSIS — E119 Type 2 diabetes mellitus without complications: Secondary | ICD-10-CM | POA: Diagnosis not present

## 2022-02-19 DIAGNOSIS — I10 Essential (primary) hypertension: Secondary | ICD-10-CM | POA: Diagnosis present

## 2022-02-19 DIAGNOSIS — M159 Polyosteoarthritis, unspecified: Secondary | ICD-10-CM | POA: Insufficient documentation

## 2022-02-19 DIAGNOSIS — I4891 Unspecified atrial fibrillation: Secondary | ICD-10-CM | POA: Diagnosis present

## 2022-02-19 LAB — MAGNESIUM: Magnesium: 1.7 mg/dL (ref 1.7–2.4)

## 2022-02-19 LAB — CBC
HCT: 41.9 % (ref 39.0–52.0)
Hemoglobin: 13.1 g/dL (ref 13.0–17.0)
MCH: 28.6 pg (ref 26.0–34.0)
MCHC: 31.3 g/dL (ref 30.0–36.0)
MCV: 91.5 fL (ref 80.0–100.0)
Platelets: 211 10*3/uL (ref 150–400)
RBC: 4.58 MIL/uL (ref 4.22–5.81)
RDW: 15.1 % (ref 11.5–15.5)
WBC: 6.1 10*3/uL (ref 4.0–10.5)
nRBC: 0 % (ref 0.0–0.2)

## 2022-02-19 LAB — BASIC METABOLIC PANEL
Anion gap: 7 (ref 5–15)
BUN: 8 mg/dL (ref 8–23)
CO2: 27 mmol/L (ref 22–32)
Calcium: 8.6 mg/dL — ABNORMAL LOW (ref 8.9–10.3)
Chloride: 103 mmol/L (ref 98–111)
Creatinine, Ser: 1.07 mg/dL (ref 0.61–1.24)
GFR, Estimated: >60 mL/min (ref >60)
Glucose, Bld: 110 mg/dL — ABNORMAL HIGH (ref 70–99)
Potassium: 4.2 mmol/L (ref 3.5–5.1)
Sodium: 137 mmol/L (ref 135–145)

## 2022-02-19 LAB — TROPONIN I (HIGH SENSITIVITY)
Troponin I (High Sensitivity): 86 ng/L — ABNORMAL HIGH (ref <18)
Troponin I (High Sensitivity): 59 ng/L — ABNORMAL HIGH (ref <18)

## 2022-02-19 MED ORDER — ASPIRIN 325 MG PO TABS: 325.0000 mg | ORAL_TABLET | Freq: Every day | ORAL | Status: DC

## 2022-02-19 MED ORDER — ACETAMINOPHEN 325 MG PO TABS: 650.0000 mg | ORAL_TABLET | ORAL | Status: DC | PRN

## 2022-02-19 MED ORDER — ONDANSETRON HCL 4 MG/2ML IJ SOLN: 4.0000 mg | Freq: Four times a day (QID) | INTRAMUSCULAR | Status: DC | PRN

## 2022-02-19 NOTE — ED Notes (Signed)
Pt ambulated to bathroom independently

## 2022-02-19 NOTE — Telephone Encounter (Signed)
Pt called to report that he has been having chest tightness since last night... he says it is tight around his chest and he is having SOB associated with it... he had Pneumonia last month but no recent cough and no palpitations.. .. but he says his chest feels like when he had VT in the past... he says his radial pulse feels very weak... I advised him that he should be assessed in the ED and should call EMS but he says he can drive but I strongly urged him to call EMS and not to drive... pt agrees and says he will call now.

## 2022-02-19 NOTE — ED Triage Notes (Signed)
Patient BIB [facility] from home with complaint of chest pressure that woke him from sleep this morning at 0300. Pain radiates into left arm and left neck. Has pacemaker, VSS within normal limits. Patient given '324mg'$  ASA, 1x SL NTG, and 144mg fentanyl en route, pain changed from 10/10 to 8/10 after fentanyl administration. 20g saline lock in left forearm.

## 2022-02-19 NOTE — ED Provider Notes (Signed)
[facility] EMERGENCY DEPARTMENT Provider Note   CSN: [id number] Arrival date & time: 02/19/22  1440     History  Chief Complaint  Patient presents with   Chest Pain    Billy Beck. is a 81 y.o. male.   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  Patient states that he woke up at 3 AM this morning and felt chest pressure and discomfort.  He endorsed nausea and some palpitations.  He did not feel any episodes of defibrillation.  He states that symptoms are similar to a prior episode of ventricular tachycardia.  He recently moved from [place] and has not yet established with a local cardiologist.  He due to his persistent chest pressure, he called EMS who gave him aspirin and nitro in route in addition to fentanyl.  He states the pain is somewhat improved.  He is left-sided and radiates to his left shoulder and left neck.  It waxes and wanes.  Denies any sharp or shooting nature of the pain.  No radiation in between his shoulder blades.  He endorses some additional shortness of breath.  Denies any cough, fever or chills.   Home Medications Prior to Admission medications   Medication Sig Start Date End Date Taking? Authorizing Provider  acetaminophen (TYLENOL) 650 MG CR tablet Take 650 mg by mouth every 8 (eight) hours as needed for pain.   Yes [provider]  albuterol (VENTOLIN HFA) 108 (90 Base) MCG/ACT inhaler Inhale 1 puff into the lungs every 6 (six) hours as needed for wheezing. 09/11/16  Yes [provider]  cholestyramine (QUESTRAN) 4 g packet Take 1 packet (4 g total) by mouth daily. Take 2 hours before or after all other medications 12/04/21  Yes Jackquline Denmark, MD  clopidogrel (PLAVIX) 75 MG tablet Take 75 mg by mouth  daily. 05/08/21  Yes [provider]  ELIQUIS 5 MG TABS tablet Take 5 mg by mouth 2 (two) times daily. 04/10/21  Yes [provider]  furosemide (LASIX) 20 MG tablet Take 20 mg by mouth daily. 05/08/21  Yes [provider]  hydroxyprophyl methylcellulose / hypromellose (ISOPTO TEARS) 0.5 % opthalmic solution Place 1 drop into both eyes daily as needed for dry eyes.   Yes [provider]  isosorbide mononitrate (IMDUR) 60 MG 24 hr tablet Take 1 tablet (60 mg total) by mouth 2 (two) times daily. 10/31/21  Yes Vickie Epley, MD  Lacosamide (VIMPAT) 150 MG TABS Take 150 mg by mouth 2 (two) times daily.   Yes [provider]  magnesium oxide (MAG-OX) 400 MG tablet Take 400 mg by mouth daily. 07/24/19  Yes [provider]  metoprolol succinate (TOPROL-XL) 50 MG 24 hr tablet Take 50 mg by mouth daily. 02/08/22  Yes [provider]  sertraline (ZOLOFT) 100 MG tablet Take 100 mg by mouth 2 (two) times daily. 01/08/21  Yes [provider]  temazepam (RESTORIL) 15 MG capsule Take 15 mg by mouth at bedtime as needed. 01/27/22  Yes [provider]      Allergies    Codeine, Fluoxetine, Tape, Gabapentin, Morphine, and Simvastatin    Review of Systems   Review of Systems  Respiratory:  Positive for shortness of breath.   Cardiovascular:  Positive for chest  pain.  All other systems reviewed and are negative.   Physical Exam Updated Vital Signs BP 105/72   Pulse 74   Temp 98.1 F (36.7 C) (Oral)   Resp 16   SpO2 95%  Physical Exam Vitals and nursing note reviewed.  Constitutional:      General: He is not in acute distress.    Appearance: He is well-developed.  HENT:     Head: Normocephalic and atraumatic.  Eyes:     Conjunctiva/sclera: Conjunctivae normal.  Neck:     Vascular: No JVD.  Cardiovascular:     Rate and Rhythm: Normal rate and regular rhythm.     Heart sounds: No murmur heard. Pulmonary:      Effort: Pulmonary effort is normal. No respiratory distress.     Breath sounds: Normal breath sounds.  Abdominal:     Palpations: Abdomen is soft.     Tenderness: There is no abdominal tenderness.  Musculoskeletal:        General: No swelling.     Cervical back: Neck supple.     Right lower leg: No edema.     Left lower leg: No edema.  Skin:    General: Skin is warm and dry.     Capillary Refill: Capillary refill takes less than 2 seconds.  Neurological:     Mental Status: He is alert.  Psychiatric:        Mood and Affect: Mood normal.     ED Results / Procedures / Treatments   Labs (all labs ordered are listed, but only abnormal results are displayed) Labs Reviewed  BASIC METABOLIC PANEL - Abnormal; Notable for the following components:      Result Value   Glucose, Bld 110 (*)    Calcium 8.6 (*)    All other components within normal limits  TROPONIN I (HIGH SENSITIVITY) - Abnormal; Notable for the following components:   Troponin I (High Sensitivity) 86 (*)    All other components within normal limits  TROPONIN I (HIGH SENSITIVITY) - Abnormal; Notable for the following components:   Troponin I (High Sensitivity) 59 (*)    All other components within normal limits  CBC  MAGNESIUM  BASIC METABOLIC PANEL  LIPID PANEL  CBC    EKG EKG Interpretation  Date/Time:  Monday February 19 2022 14:44:26 EDT Ventricular Rate:  74 PR Interval:    QRS Duration: 154 QT Interval:  424 QTC Calculation: 470 R Axis:   -76 Text Interpretation: Paced rhythm Right bundle branch block Left anterior fascicular block Septal infarct , age undetermined Abnormal ECG When compared with ECG of 20-Jan-2022 13:29, PREVIOUS ECG IS PRESENT Reconfirmed by Regan Lemming ([id number]) on 02/20/2022 12:00:04 AM  Radiology DG Chest 2 View  Result Date: 02/19/2022 CLINICAL DATA:  Chest pain. EXAM: CHEST - 2 VIEW COMPARISON:  Chest radiograph November 03, 2021 FINDINGS: Dual lead pacer apparatus overlies the left  hemithorax, leads are stable in position. Left atrial appendage clip unchanged. Stent graft unchanged. Stable cardiac and mediastinal contours. Elevated right hemidiaphragm. Bibasilar heterogeneous opacities. No pleural effusion or pneumothorax. Thoracic spine degenerative changes. IMPRESSION: No active cardiopulmonary disease. Electronically Signed   By: Lovey Newcomer M.D.   On: 02/19/2022 15:38    Procedures Procedures    Medications Ordered in ED Medications  acetaminophen (TYLENOL) tablet 650 mg (has no administration in time range)  ondansetron (ZOFRAN) injection 4 mg (has no administration in time range)    ED Course/ Medical Decision Making/ A&P Clinical Course as  of 02/20/22 0000  Mon Feb 19, 2022  1854 Troponin I (High Sensitivity)(!): 86 [JL]    Clinical Course User Index [JL] Regan Lemming, MD                           Medical Decision Making Amount and/or Complexity of Data Reviewed Labs: ordered. Decision-making details documented in ED Course. Radiology: ordered.  Risk Decision regarding hospitalization.     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  Patient states that he woke up at 3 AM this morning and felt chest pressure and discomfort.  He endorsed nausea and some palpitations.  He did not feel any episodes of defibrillation.  He states that symptoms are similar to a prior episode of ventricular tachycardia.  He recently moved from [place] and has not yet established with a local cardiologist.  He due to his persistent chest pressure, he called EMS who gave him aspirin and nitro in route in addition to fentanyl.  He states the pain is somewhat improved.  He is left-sided and radiates to his left shoulder and left neck.  It waxes and wanes.  Denies any sharp or shooting nature of the pain.  No radiation in  between his shoulder blades.  He endorses some additional shortness of breath.  Denies any cough, fever or chills.  Arrival, the patient was vitally stable, afebrile, not tachycardic or tachypneic, normotensive BP 104/72, saturating 96% on room air.  Sinus rhythm noted on cardiac telemetry.  EKG was performed which revealed an undetermined rhythm likely paced rhythm, bifascicular block present, ventricular rate 74.  Evaluation significant for elevated troponin 86, repeat downtrending to 59.  Low concern for ACS at this time.  Considered cardiac arrhythmia and demand ischemia.  Magnesium normal at 1.7, BMP unremarkable, CBC unremarkable.  Chest x-ray was performed revealed no acute cardiac or pulmonary abnormality.  Spoke with Dr. Wilmon Pali, who will admit the patient to cardiology. Pt had no episodes of VF/VT. Pt subsequently admitted in stable condition.   Final Clinical Impression(s) / ED Diagnoses Final diagnoses:  Primary hypertension  Paroxysmal A-fib ([place])  ICD (implantable cardioverter-defibrillator) in place  Primary osteoarthritis involving multiple joints    Rx / DC Orders ED Discharge Orders          Ordered    Amb referral to AFIB Clinic        02/19/22 2357              Regan Lemming, MD 02/20/22 0000

## 2022-02-19 NOTE — ED Notes (Signed)
[employer] interrogator unable to connect to pts pacemaker. [employer] called, local representative to call this RN back within 30 minutes

## 2022-02-19 NOTE — Telephone Encounter (Signed)
Pt c/o of Chest Pain: STAT if CP now or developed within 24 hours  1. Are you having CP right now? Yes, not major. It's there it's uncomfortable.   2. Are you experiencing any other symptoms (ex. SOB, nausea, vomiting, sweating)? no  3. How long have you been experiencing CP? Pain started about 3:30 this morning.   4. Is your CP continuous or coming and going? Last night it was there for about 3-5 mins. Right now is continuous.   5. Have you taken Nitroglycerin? Yes   Patient states the the pain started in the center of his chest, all the way across his chest, and went down the upper left arm, shoulder area.  ?

## 2022-02-19 NOTE — ED Provider Triage Note (Signed)
Emergency Medicine Provider Triage Evaluation Note  Coury Grieger. , a 81 y.o. male  was evaluated in triage.  Pt complains of chest pain starting at 3am this morning.  States that pain feels similar to previous episodes that have led to him getting a pacemaker and a defibrillator.  Recently moved here from [place] after his wife passed away, has not established with a cardiologist here yet.  EMS gave him aspirin, nitro, and fentanyl in route.  He states that pain is improved after the fentanyl.  It feels like pressure on the left side of his chest that radiates to his left shoulder and his left neck.  Pain waxes and wanes, but never completely goes away.  Review of Systems  Positive: Chest pain Negative: Shortness of breath, headache, numbness  Physical Exam  BP 104/72 (BP Location: Left Arm)   Pulse 74   Temp 98.1 F (36.7 C)   Resp 16   SpO2 96%  Gen:   Awake, no distress   Resp:  Normal effort  MSK:   Moves extremities without difficulty  Other:    Medical Decision Making  Medically screening exam initiated at 3:10 PM.  Appropriate orders placed.  Rocco Serene. was informed that the remainder of the evaluation will be completed by another provider, this initial triage assessment does not replace that evaluation, and the importance of remaining in the ED until their evaluation is complete.  Workup initiated   Emilea Goga T, PA-C 02/19/22 1512

## 2022-02-20 ENCOUNTER — Observation Stay (HOSPITAL_BASED_OUTPATIENT_CLINIC_OR_DEPARTMENT_OTHER): Payer: Medicare Other

## 2022-02-20 DIAGNOSIS — I4891 Unspecified atrial fibrillation: Secondary | ICD-10-CM | POA: Diagnosis not present

## 2022-02-20 DIAGNOSIS — R079 Chest pain, unspecified: Secondary | ICD-10-CM | POA: Diagnosis not present

## 2022-02-20 DIAGNOSIS — R7989 Other specified abnormal findings of blood chemistry: Secondary | ICD-10-CM | POA: Diagnosis not present

## 2022-02-20 DIAGNOSIS — I480 Paroxysmal atrial fibrillation: Secondary | ICD-10-CM

## 2022-02-20 LAB — LIPID PANEL
Cholesterol: 148 mg/dL (ref 0–200)
HDL: 41 mg/dL (ref >40)
LDL Cholesterol: 86 mg/dL (ref 0–99)
Total CHOL/HDL Ratio: 3.6 RATIO
Triglycerides: 105 mg/dL (ref <150)
VLDL: 21 mg/dL (ref 0–40)

## 2022-02-20 LAB — CBC
HCT: 39.2 % (ref 39.0–52.0)
Hemoglobin: 12.7 g/dL — ABNORMAL LOW (ref 13.0–17.0)
MCH: 28.7 pg (ref 26.0–34.0)
MCHC: 32.4 g/dL (ref 30.0–36.0)
MCV: 88.7 fL (ref 80.0–100.0)
Platelets: 183 10*3/uL (ref 150–400)
RBC: 4.42 MIL/uL (ref 4.22–5.81)
RDW: 15.1 % (ref 11.5–15.5)
WBC: 4.5 10*3/uL (ref 4.0–10.5)
nRBC: 0 % (ref 0.0–0.2)

## 2022-02-20 LAB — ECHOCARDIOGRAM COMPLETE
AR max vel: 2.08 cm2
AV Area VTI: 2.43 cm2
AV Area mean vel: 2.04 cm2
AV Mean grad: 5 mmHg
AV Peak grad: 10 mmHg
Ao pk vel: 1.58 m/s
S' Lateral: 3.6 cm

## 2022-02-20 LAB — BASIC METABOLIC PANEL
Anion gap: 7 (ref 5–15)
BUN: 10 mg/dL (ref 8–23)
CO2: 26 mmol/L (ref 22–32)
Calcium: 8.2 mg/dL — ABNORMAL LOW (ref 8.9–10.3)
Chloride: 102 mmol/L (ref 98–111)
Creatinine, Ser: 1 mg/dL (ref 0.61–1.24)
GFR, Estimated: >60 mL/min (ref >60)
Glucose, Bld: 113 mg/dL — ABNORMAL HIGH (ref 70–99)
Potassium: 3.5 mmol/L (ref 3.5–5.1)
Sodium: 135 mmol/L (ref 135–145)

## 2022-02-20 LAB — PROTIME-INR
INR: 1.1 (ref 0.8–1.2)
Prothrombin Time: 14.4 seconds (ref 11.4–15.2)

## 2022-02-20 LAB — HEPARIN LEVEL (UNFRACTIONATED)
Heparin Unfractionated: 0.49 IU/mL (ref 0.30–0.70)
Heparin Unfractionated: 0.41 IU/mL (ref 0.30–0.70)

## 2022-02-20 LAB — TROPONIN I (HIGH SENSITIVITY)
Troponin I (High Sensitivity): 19 ng/L — ABNORMAL HIGH (ref <18)
Troponin I (High Sensitivity): 20 ng/L — ABNORMAL HIGH (ref <18)

## 2022-02-20 LAB — APTT
aPTT: 73 seconds — ABNORMAL HIGH (ref 24–36)
aPTT: 74 seconds — ABNORMAL HIGH (ref 24–36)

## 2022-02-20 LAB — BRAIN NATRIURETIC PEPTIDE: B Natriuretic Peptide: 124.6 pg/mL — ABNORMAL HIGH (ref 0.0–100.0)

## 2022-02-20 MED ORDER — CHOLESTYRAMINE 4 G PO PACK
4.0000 g | PACK | Freq: Every day | ORAL | Status: DC
  Administered 2022-02-20 – 2022-02-21 (×2): 4 g via ORAL
  Filled 2022-02-20 (×2): qty 1

## 2022-02-20 MED ORDER — TEMAZEPAM 7.5 MG PO CAPS: 15.0000 mg | ORAL_CAPSULE | Freq: Every evening | ORAL | Status: DC | PRN

## 2022-02-20 MED ORDER — SODIUM CHLORIDE 0.9 % IV SOLN: INTRAVENOUS | Status: DC

## 2022-02-20 MED ORDER — LACOSAMIDE 50 MG PO TABS
150.0000 mg | ORAL_TABLET | Freq: Two times a day (BID) | ORAL | Status: DC
  Administered 2022-02-20 – 2022-02-22 (×5): 150 mg via ORAL
  Filled 2022-02-20 (×5): qty 3

## 2022-02-20 MED ORDER — CLOPIDOGREL BISULFATE 75 MG PO TABS
75.0000 mg | ORAL_TABLET | Freq: Every day | ORAL | Status: DC
  Administered 2022-02-20 – 2022-02-21 (×2): 75 mg via ORAL
  Filled 2022-02-20 (×3): qty 1

## 2022-02-20 MED ORDER — METOPROLOL SUCCINATE ER 50 MG PO TB24
50.0000 mg | ORAL_TABLET | Freq: Every day | ORAL | Status: DC
  Administered 2022-02-20 – 2022-02-22 (×3): 50 mg via ORAL
  Filled 2022-02-20: qty 1
  Filled 2022-02-20: qty 2
  Filled 2022-02-20: qty 1

## 2022-02-20 MED ORDER — HEPARIN (PORCINE) 25000 UT/250ML-% IV SOLN
1300.0000 [IU]/h | INTRAVENOUS | Status: AC
  Administered 2022-02-20 – 2022-02-21 (×3): 1300 [IU]/h via INTRAVENOUS
  Filled 2022-02-20 (×3): qty 250

## 2022-02-20 MED ORDER — LACOSAMIDE 150 MG PO TABS: 150.0000 mg | ORAL_TABLET | Freq: Two times a day (BID) | ORAL | Status: DC

## 2022-02-20 MED ORDER — SERTRALINE HCL 100 MG PO TABS
100.0000 mg | ORAL_TABLET | Freq: Two times a day (BID) | ORAL | Status: DC
  Administered 2022-02-20 – 2022-02-22 (×5): 100 mg via ORAL
  Filled 2022-02-20 (×5): qty 1

## 2022-02-20 MED ORDER — APIXABAN 5 MG PO TABS: 5.0000 mg | ORAL_TABLET | Freq: Two times a day (BID) | ORAL | Status: DC

## 2022-02-20 MED ORDER — NITROGLYCERIN 0.4 MG SL SUBL
0.4000 mg | SUBLINGUAL_TABLET | SUBLINGUAL | Status: DC | PRN
  Administered 2022-02-22 (×2): 0.4 mg via SUBLINGUAL
  Filled 2022-02-20 (×2): qty 1

## 2022-02-20 NOTE — ED Notes (Signed)
Phlebotomy requested to draw labs.  

## 2022-02-20 NOTE — H&P (Signed)
Cardiology Admission History and Physical   Patient ID: Billy Beck. MRN: [id number]; DOB: [birth year]   Admission date: 02/19/2022  PCP:  Billy Beck, [place] Providers Cardiologist:  None  Electrophysiologist:  Vickie Epley, MD       Chief Complaint: Chest pain  Patient Profile:   Billy Beck. is a 81 y.o. male with PVC, VT status post ablation x2, dual-chamber [employer] Jude ICD (implantable cardioverter-defibrillator) since 2017,S/P TAVR (transcatheter aortic valve replacement) in 2012, paroxysmal atrial fibrillation, CHA2DS2-VASc score of 4, has bled of 1, on anticoagulation with Eliquis, status post prior PVI?,  Primary hypertension, chronic diastolic heart failure ([place]) history of Crohn's ileitis status post ileocecal resection history of seizures who is being seen 02/20/2022 for the evaluation of episode of chest pain earlier this morning  History of Present Illness:   Billy Beck reports an episode of chest pain at around 5 AM earlier this morning that woke him up from sleep.  Patient denies any palpitations, device shocks, presyncope or syncope, difficulty breathing at times.  Lasted for about 30 to 40 minutes, self terminated..  Patient reports very similar episode in 2017 when he had episode of ventricular tachycardia.  Recently patient was admitted to to the [facility] with treatment of pneumonia gram-positive pneumonia and superimposed setting of rhinovirus.  Following discharge in the hospital patient reports feeling well.  Above-mentioned episode earlier today.  Otherwise patient denies any exertional dyspnea, shortness of breath palpitations.  Patient's active and independent with his ADL. Following presentation to emergency room patient remained hemodynamically stable, no acute distress.  Patient is noted to be in atrial fibrillation with episodes of paced rhythm on telemetry.  Patient denies any recent fevers,  chills, cough, nausea or diarrhea.   Past Medical History:  Diagnosis Date   Anxiety    Aortic valve defect    Cancer ([place])    Depression    Diabetes mellitus without complication ([place])    Seizures ([place])     Past Surgical History:  Procedure Laterality Date   ABLATION     AORTIC VALVE REPLACEMENT     COLONOSCOPY WITH ESOPHAGOGASTRODUODENOSCOPY (EGD)  06/28/2016   [facility]   ESOPHAGOGASTRODUODENOSCOPY  12/04/2017   [facility].   PACEMAKER IMPLANT       Medications Prior to Admission: Prior to Admission medications   Medication Sig Start Date End Date Taking? Authorizing Provider  acetaminophen (TYLENOL) 650 MG CR tablet Take 650 mg by mouth every 8 (eight) hours as needed for pain.   Yes [provider]  albuterol (VENTOLIN HFA) 108 (90 Base) MCG/ACT inhaler Inhale 1 puff into the lungs every 6 (six) hours as needed for wheezing. 09/11/16  Yes [provider]  cholestyramine (QUESTRAN) 4 g packet Take 1 packet (4 g total) by mouth daily. Take 2 hours before or after all other medications 12/04/21  Yes Jackquline Denmark, MD  clopidogrel (PLAVIX) 75 MG tablet Take 75 mg by mouth daily. 05/08/21  Yes [provider]  ELIQUIS 5 MG TABS tablet Take 5 mg by mouth 2 (two) times daily. 04/10/21  Yes [provider]  furosemide (LASIX) 20 MG tablet Take 20 mg by mouth daily. 05/08/21  Yes [provider]  hydroxyprophyl methylcellulose / hypromellose (ISOPTO TEARS) 0.5 % opthalmic solution Place 1 drop into both eyes daily as needed for dry eyes.   Yes [provider]  isosorbide mononitrate (IMDUR) 60 MG 24 hr tablet  Take 1 tablet (60 mg total) by mouth 2 (two) times daily. 10/31/21  Yes Vickie Epley, MD  Lacosamide (VIMPAT) 150 MG TABS Take 150 mg by mouth 2 (two) times daily.   Yes [provider]  magnesium oxide (MAG-OX) 400 MG tablet Take 400 mg by mouth daily. 07/24/19  Yes [provider]  metoprolol  succinate (TOPROL-XL) 50 MG 24 hr tablet Take 50 mg by mouth daily. 02/08/22  Yes [provider]  sertraline (ZOLOFT) 100 MG tablet Take 100 mg by mouth 2 (two) times daily. 01/08/21  Yes [provider]  temazepam (RESTORIL) 15 MG capsule Take 15 mg by mouth at bedtime as needed. 01/27/22  Yes [provider]     Allergies:    Allergies  Allergen Reactions   Codeine Other (See Comments)    Other reaction(s): Other - See Comments Chest pain  Chest pain  Chest pressure.  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    Tape     Other reaction(s): Other (comments) Blisters  Blisters, redness, itching PAPER TAPE PREFERRED    Gabapentin Nausea Only and Other (See Comments)    Other reaction(s): Nausea and/or vomiting-Intolerance dizziness dizziness    Morphine     Other reaction(s): Other - See Comments, Unknown Morphine doesn't work  Morphine doesn't work  "Does not work for me"    Simvastatin     Other reaction(s): Unknown    Social History:   Social History   Socioeconomic History   Marital status: Widowed    Spouse name: Not on file   Number of children: 2   Years of education: Not on file   Highest education level: Not on file  Occupational History   Occupation: retired  Tobacco Use   Smoking status: Former    Types: Cigarettes   Smokeless tobacco: Never  Scientific laboratory technician Use: Never used  Substance and Sexual Activity   Alcohol use: Not Currently   Drug use: Never   Sexual activity: Not on file  Other Topics Concern   Not on file  Social History Narrative   Not on file   Social Determinants of Health   Financial Resource Strain: Not on file  Food Insecurity: Not on file  Transportation Needs: Not on file   Physical Activity: Not on file  Stress: Not on file  Social Connections: Not on file  Intimate Partner Violence: Not on file    Family History:   The patient's family history includes Breast cancer in his father; Cancer in his mother; Lymphoma in his father; Testicular cancer in his father. There is no history of Colon cancer, Rectal cancer, or Stomach cancer.    ROS:  Please see the history of present illness.  All other ROS reviewed and negative.     Physical Exam/Data:   Vitals:   02/19/22 2300 02/19/22 2315 02/19/22 2330 02/19/22 2345  BP: 100/68 103/68 103/67 105/72  Pulse: 74 76 76 74  Resp: '15 17 16 16  '$ Temp:      TempSrc:      SpO2: 97% 100% 96% 95%   No intake or output data in the 24 hours ending 02/20/22 0001    12/07/2021    1:43 PM 11/23/2021  9:15 AM 11/01/2021    3:08 PM  Last 3 Weights  Weight (lbs) 188 lb 186 lb 188 lb 2 oz  Weight (kg) 85.276 kg 84.369 kg 85.333 kg     There is no height or weight on file to calculate BMI.  General:  Well nourished, well developed, in no acute distress HEENT: normal Neck: no JVD Vascular: No carotid bruits; Distal pulses 2+ bilaterally   Cardiac:  normal S1, S2; RRR; no murmur  Lungs:  clear to auscultation bilaterally, no wheezing, rhonchi or rales  Abd: soft, nontender, no hepatomegaly  Ext: no edema Musculoskeletal:  No deformities, BUE and BLE strength normal and equal Skin: warm and dry  Neuro:  CNs 2-12 intact, no focal abnormalities noted Psych:  Normal affect    EKG:  The ECG that was done 02/19/2022 was personally reviewed and demonstrates A-fib with episodes of V paced complexes  Relevant CV Studies:         12/2021   Technically difficult study.    The left ventricular systolic function is normal (55-65%).    Unable to assess left ventricular diastolic function (paced rhythm).    The right ventricular systolic function is normal.    The left atrium is severely dilated.    There is a  bioprosthetic aortic valve present. The prosthetic valve  function is normal.    Mild mitral valve regurgitation.     This result has an attachment that is not available.    Resting ECG: indicated a paced rhythm.    Stress ECG: non diagnostic.    Protocol: Pharmacologic using regadenoson    Hemodynamic Response: adequate.    Symptoms: shortness of breath.    Left ventricle cavity size is normal.    LV Function: EF is 63%. LV function is normal. The wall motion is  normal.    Left ventricular perfusion: normal.    Low risk study   LHC 2015 20-30 percent in the proximal and mid LAD; 30% in the circumflex; 20-30% in the proximal and mid RCA. (2012 and cath 10/2013)   Laboratory Data:  High Sensitivity Troponin:   Recent Labs  Lab 02/19/22 1452 02/19/22 1834  TROPONINIHS 86* 59*      Chemistry Recent Labs  Lab 02/19/22 1452 02/19/22 1834  NA 137  --   K 4.2  --   CL 103  --   CO2 27  --   GLUCOSE 110*  --   BUN 8  --   CREATININE 1.07  --   CALCIUM 8.6*  --   MG  --  1.7  GFRNONAA >60  --   ANIONGAP 7  --     No results for input(s): "PROT", "ALBUMIN", "AST", "ALT", "ALKPHOS", "BILITOT" in the last 168 hours. Lipids No results for input(s): "CHOL", "TRIG", "HDL", "LABVLDL", "LDLCALC", "CHOLHDL" in the last 168 hours. Hematology Recent Labs  Lab 02/19/22 1452  WBC 6.1  RBC 4.58  HGB 13.1  HCT 41.9  MCV 91.5  MCH 28.6  MCHC 31.3  RDW 15.1  PLT 211   Thyroid No results for input(s): "TSH", "FREET4" in the last 168 hours. BNPNo results for input(s): "BNP", "PROBNP" in the last 168 hours.  DDimer No results for input(s): "DDIMER" in the last 168 hours.   Radiology/Studies:  DG Chest 2 View  Result Date: 02/19/2022 CLINICAL DATA:  Chest pain. EXAM: CHEST - 2 VIEW COMPARISON:  Chest radiograph November 03, 2021 FINDINGS: Dual lead pacer apparatus overlies the left  hemithorax, leads are stable in position. Left atrial appendage clip unchanged. Stent graft  unchanged. Stable cardiac and mediastinal contours. Elevated right hemidiaphragm. Bibasilar heterogeneous opacities. No pleural effusion or pneumothorax. Thoracic spine degenerative changes. IMPRESSION: No active cardiopulmonary disease. Electronically Signed   By: Lovey Newcomer M.D.   On: 02/19/2022 15:38     Assessment and Plan:   Atrial fibrillation with episode of RVR. Now patient is rate controlled.  She reports probably missing a dose of 2 Eliquis over the last 2 weeks.  He denies any episodes of bleeding on Eliquis.  Patient had a history of PVI?  However he is not sure about this when he started about his PVCs ablations.  At this point in time patient is rate controlled had a prolonged discussion about rate and rhythm control.  I believe he is a very good candidate for rhythm control. Last dose of Eliquis 10 9 in the AM.  We will start patient on heparin drip.  N.p.o. after midnight for TEE and possible cardioversion in the morning.  Once left atrial appendage is cleared post DCCV we can consider antiarrhythmic medications flecainide versus dofetilide versus sotalol.  Keep potassium of 4, medium of 2  2.  Elevated troponin.  ACS ruled out.  Probably in the setting of A-fib RVR.  3.VT (ventricular tachycardia) ,sp ICD (implantable cardioverter-defibrillator) in place ( DC AICD, abbott)  S/P TAVR (transcatheter aortic valve replacement) and chronic diastolic heart failure ([place]) Patient is warm and well-perfused.  Patient is close to euvolemia.  Follow-up TTE.    Risk Assessment/Risk Scores:         CHA2DS2-VASc Score =   4  This indicates a  4 % annual risk of stroke. The patient's score is based upon:        Severity of Illness: The appropriate patient status for this patient is OBSERVATION. Observation status is judged to be reasonable and necessary in order to provide the required intensity of service to ensure the patient's safety. The patient's presenting symptoms, physical  exam findings, and initial radiographic and laboratory data in the context of their medical condition is felt to place them at decreased risk for further clinical deterioration. Furthermore, it is anticipated that the patient will be medically stable for discharge from the hospital within 2 midnights of admission.    For questions or updates, please contact [place] Please consult [email or web address] for contact info under     Signed, Warren Danes, MD  02/20/2022 12:01 AM

## 2022-02-20 NOTE — Progress Notes (Signed)
  Echocardiogram 2D Echocardiogram has been performed.  Billy Beck 02/20/2022, 3:56 PM

## 2022-02-20 NOTE — H&P (Incomplete)
Cardiology Admission History and Physical   Patient ID: Billy Beck. MRN: [id number]; DOB: 03-Oct-1940   Admission date: 02/19/2022  PCP:  Horald Pollen, [place] Providers Cardiologist:  None  Electrophysiologist:  Vickie Epley, MD  { Click here to update MD or APP on Care Team, Refresh:1}     Chief Complaint:  ***  Patient Profile:   Billy Beck. is a 81 y.o. male with *** who is being seen 02/20/2022 for the evaluation of ***.  History of Present Illness:   Billy Beck ***   Past Medical History:  Diagnosis Date  . Anxiety   . Aortic valve defect   . Cancer ([place])   . Depression   . Diabetes mellitus without complication ([place])   . Seizures ([place])     Past Surgical History:  Procedure Laterality Date  . ABLATION    . AORTIC VALVE REPLACEMENT    . COLONOSCOPY WITH ESOPHAGOGASTRODUODENOSCOPY (EGD)  06/28/2016   [facility]  . ESOPHAGOGASTRODUODENOSCOPY  12/04/2017   [facility].  Marland Kitchen PACEMAKER IMPLANT       Medications Prior to Admission: Prior to Admission medications   Medication Sig Start Date End Date Taking? Authorizing Provider  acetaminophen (TYLENOL) 650 MG CR tablet Take 650 mg by mouth every 8 (eight) hours as needed for pain.   Yes [provider]  albuterol (VENTOLIN HFA) 108 (90 Base) MCG/ACT inhaler Inhale 1 puff into the lungs every 6 (six) hours as needed for wheezing. 09/11/16  Yes [provider]  cholestyramine (QUESTRAN) 4 g packet Take 1 packet (4 g total) by mouth daily. Take 2 hours before or after all other medications 12/04/21  Yes Jackquline Denmark, MD  clopidogrel (PLAVIX) 75 MG tablet Take 75 mg by mouth daily. 05/08/21  Yes [provider]  ELIQUIS 5 MG TABS tablet Take 5 mg by mouth 2 (two) times daily. 04/10/21  Yes [provider]  furosemide (LASIX) 20 MG tablet Take 20 mg by mouth daily. 05/08/21  Yes [provider]  hydroxyprophyl  methylcellulose / hypromellose (ISOPTO TEARS) 0.5 % opthalmic solution Place 1 drop into both eyes daily as needed for dry eyes.   Yes [provider]  isosorbide mononitrate (IMDUR) 60 MG 24 hr tablet Take 1 tablet (60 mg total) by mouth 2 (two) times daily. 10/31/21  Yes Vickie Epley, MD  Lacosamide (VIMPAT) 150 MG TABS Take 150 mg by mouth 2 (two) times daily.   Yes [provider]  magnesium oxide (MAG-OX) 400 MG tablet Take 400 mg by mouth daily. 07/24/19  Yes [provider]  metoprolol succinate (TOPROL-XL) 50 MG 24 hr tablet Take 50 mg by mouth daily. 02/08/22  Yes [provider]  sertraline (ZOLOFT) 100 MG tablet Take 100 mg by mouth 2 (two) times daily. 01/08/21  Yes [provider]  temazepam (RESTORIL) 15 MG capsule Take 15 mg by mouth at bedtime as needed. 01/27/22  Yes [provider]     Allergies:    Allergies  Allergen Reactions  . Codeine Other (See Comments)    Other reaction(s): Other - See Comments Chest pain  Chest pain  Chest pressure.  Pt has taken recently though, without any problems   . 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 Tape     Other reaction(s): Other (comments) Blisters  Blisters, redness, itching PAPER TAPE PREFERRED   . Gabapentin Nausea Only and Other (See Comments)    Other reaction(s): Nausea and/or vomiting-Intolerance dizziness dizziness   . Morphine     Other reaction(s): Other - See Comments, Unknown Morphine doesn't work  Morphine doesn't work  "Does not work for me"   . Simvastatin     Other reaction(s): Unknown    Social History:   Social History   Socioeconomic History  . Marital status: Widowed    Spouse name: Not on file  . Number of children: 2  . Years of education: Not on file  . Highest  education level: Not on file  Occupational History  . Occupation: retired  Tobacco Use  . Smoking status: Former    Types: Cigarettes  . Smokeless tobacco: Never  Vaping Use  . Vaping Use: Never used  Substance and Sexual Activity  . Alcohol use: Not Currently  . Drug use: Never  . Sexual activity: Not on file  Other Topics Concern  . Not on file  Social History Narrative  . Not on file   Social Determinants of Health   Financial Resource Strain: Not on file  Food Insecurity: Not on file  Transportation Needs: Not on file  Physical Activity: Not on file  Stress: Not on file  Social Connections: Not on file  Intimate Partner Violence: Not on file    Family History:  *** The patient's family history includes Breast cancer in his father; Cancer in his mother; Lymphoma in his father; Testicular cancer in his father. There is no history of Colon cancer, Rectal cancer, or Stomach cancer.    ROS:  Please see the history of present illness.  ***All other ROS reviewed and negative.     Physical Exam/Data:   Vitals:   02/19/22 2300 02/19/22 2315 02/19/22 2330 02/19/22 2345  BP: 100/68 103/68 103/67 105/72  Pulse: 74 76 76 74  Resp: '15 17 16 16  '$ Temp:      TempSrc:      SpO2: 97% 100% 96% 95%   No intake or output data in the 24 hours ending 02/20/22 0001    12/07/2021    1:43 PM 11/23/2021    9:15 AM 11/01/2021    3:08 PM  Last 3 Weights  Weight (lbs) 188 lb 186 lb 188 lb 2 oz  Weight (kg) 85.276 kg 84.369 kg 85.333 kg     There is no height or weight on file to calculate BMI.  General:  Well nourished, well developed, in no acute distress*** HEENT: normal Neck: no*** JVD Vascular: No carotid bruits; Distal pulses 2+ bilaterally   Cardiac:  normal S1, S2; RRR; no murmur *** Lungs:  clear to auscultation bilaterally, no wheezing, rhonchi or rales  Abd: soft, nontender, no hepatomegaly  Ext: no*** edema Musculoskeletal:  No deformities, BUE and BLE strength normal  and equal Skin: warm and dry  Neuro:  CNs 2-12 intact, no focal abnormalities noted Psych:  Normal affect    EKG:  The ECG that was done *** was personally reviewed and demonstrates ***  Relevant CV Studies: ***  Laboratory Data:  High Sensitivity Troponin:   Recent Labs  Lab 02/19/22 1452 02/19/22 1834  TROPONINIHS 86* 59*      Chemistry Recent Labs  Lab 02/19/22 1452 02/19/22 1834  NA 137  --   K 4.2  --  CL 103  --   CO2 27  --   GLUCOSE 110*  --   BUN 8  --   CREATININE 1.07  --   CALCIUM 8.6*  --   MG  --  1.7  GFRNONAA >60  --   ANIONGAP 7  --     No results for input(s): "PROT", "ALBUMIN", "AST", "ALT", "ALKPHOS", "BILITOT" in the last 168 hours. Lipids No results for input(s): "CHOL", "TRIG", "HDL", "LABVLDL", "LDLCALC", "CHOLHDL" in the last 168 hours. Hematology Recent Labs  Lab 02/19/22 1452  WBC 6.1  RBC 4.58  HGB 13.1  HCT 41.9  MCV 91.5  MCH 28.6  MCHC 31.3  RDW 15.1  PLT 211   Thyroid No results for input(s): "TSH", "FREET4" in the last 168 hours. BNPNo results for input(s): "BNP", "PROBNP" in the last 168 hours.  DDimer No results for input(s): "DDIMER" in the last 168 hours.   Radiology/Studies:  DG Chest 2 View  Result Date: 02/19/2022 CLINICAL DATA:  Chest pain. EXAM: CHEST - 2 VIEW COMPARISON:  Chest radiograph November 03, 2021 FINDINGS: Dual lead pacer apparatus overlies the left hemithorax, leads are stable in position. Left atrial appendage clip unchanged. Stent graft unchanged. Stable cardiac and mediastinal contours. Elevated right hemidiaphragm. Bibasilar heterogeneous opacities. No pleural effusion or pneumothorax. Thoracic spine degenerative changes. IMPRESSION: No active cardiopulmonary disease. Electronically Signed   By: Lovey Newcomer M.D.   On: 02/19/2022 15:38     Assessment and Plan:   ***   Risk Assessment/Risk Scores:  {Complete the following score calculators/questions to meet required metrics.  Press F2:1}  {Is  the patient being seen for unstable angina, ACS, NSTEMI or STEMI?:[phone]} {Does this patient have CHF or CHF symptoms?      :[id number]} {Does this patient have ATRIAL FIBRILLATION?[phone]}   Severity of Illness: {Observation/Inpatient:21159}   For questions or updates, please contact [place] Please consult [email or web address] for contact info under     Signed, Warren Danes, MD  02/20/2022 12:01 AM

## 2022-02-20 NOTE — Progress Notes (Signed)
ANTICOAGULATION CONSULT NOTE - Initial Consult  Pharmacy Consult for Heparin (holding apixaban) Indication: atrial fibrillation  Allergies  Allergen Reactions   Codeine Other (See Comments)    Other reaction(s): Other - See Comments Chest pain  Chest pain  Chest pressure.  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    Tape     Other reaction(s): Other (comments) Blisters  Blisters, redness, itching PAPER TAPE PREFERRED    Gabapentin Nausea Only and Other (See Comments)    Other reaction(s): Nausea and/or vomiting-Intolerance dizziness dizziness    Morphine     Other reaction(s): Other - See Comments, Unknown Morphine doesn't work  Morphine doesn't work  "Does not work for me"    Simvastatin     Other reaction(s): Unknown     Vital Signs: Temp: 97.8 F (36.6 C) (10/10 0316) Temp Source: Oral (10/10 0316) BP: 106/73 (10/10 0300) Pulse Rate: 72 (10/10 0300)  Labs: Recent Labs    02/19/22 1452 02/19/22 1834  HGB 13.1  --   HCT 41.9  --   PLT 211  --   CREATININE 1.07  --   TROPONINIHS 86* 59*    CrCl cannot be calculated (Unknown ideal weight.).   Medical History: Past Medical History:  Diagnosis Date   Anxiety    Aortic valve defect    Cancer ([place])    Depression    Diabetes mellitus without complication ([place])    Seizures ([place])     Assessment: 81 y/o M  here with chest pain/afib. Starting heparin. On apixaban PTA for afib, last dose per MD note is 10/9 AM, ok to start heparin now. Anticipate using aPTT to dose. Above labs reviewed.   Goal of Therapy:  Heparin level 0.3-0.7 units/ml aPTT 66-102 seconds Monitor platelets by anticoagulation protocol: Yes   Plan:  Start heparin drip at 1300 units/hr 1100 aPTT and heparin  level Daily CBC, heparin level, and aPTT Monitor for bleeding  Narda Bonds, PharmD, BCPS Clinical Pharmacist Phone: [phone]

## 2022-02-20 NOTE — ED Notes (Signed)
ED TO INPATIENT HANDOFF REPORT  ED Nurse Name and Phone #: Stanton Kidney, RN  S Name/Age/Gender Rocco Serene 81 y.o. male Room/Bed: 003C/003C  Code Status   Code Status: Full Code  Home/SNF/Other Home Patient oriented to: self, place, time, and situation Is this baseline? Yes   Triage Complete: Triage complete  Chief Complaint Atrial fibrillation [facility]) [I48.91]  Triage Note Patient BIB [facility] from home with complaint of chest pressure that woke him from sleep this morning at 0300. Pain radiates into left arm and left neck. Has pacemaker, VSS within normal limits. Patient given '324mg'$  ASA, 1x SL NTG, and 171mg fentanyl en route, pain changed from 10/10 to 8/10 after fentanyl administration. 20g saline lock in left forearm.   Allergies Allergies  Allergen Reactions   Codeine Other (See Comments)    Other reaction(s): Other - See Comments Chest pain  Chest pain  Chest pressure.  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    Tape     Other reaction(s): Other (comments) Blisters  Blisters, redness, itching PAPER TAPE PREFERRED    Gabapentin Nausea Only and Other (See Comments)    Other reaction(s): Nausea and/or vomiting-Intolerance dizziness dizziness    Morphine     Other reaction(s): Other - See Comments, Unknown Morphine doesn't work  Morphine doesn't work  "Does not work for me"    Simvastatin     Other reaction(s): Unknown    Level of Care/Admitting Diagnosis ED Disposition     ED Disposition  [place][[id number]]  Level of Care: Progressive [102]  Admit to Progressive based on following criteria: CARDIOVASCULAR & THORACIC of moderate stability with acute coronary  syndrome symptoms/low risk myocardial infarction/hypertensive urgency/arrhythmias/heart failure potentially compromising stability and stable post cardiovascular intervention patients.  [facility] equivalent level of care is available:: No  Covid Evaluation: Asymptomatic - no recent exposure (last 10 days) testing not required  Diagnosis: Atrial fibrillation ([place] [427.31.ICD-9-CM]  Admitting Physician: MMelida Quitter[[[id number]] Attending Physician: MMelida Quitter[[[id number]]         B Medical/Surgery History Past Medical History:  Diagnosis Date   Anxiety    Aortic valve defect    Cancer ([place]    Depression    Diabetes mellitus without complication ([place]    Seizures ([place]    Past Surgical History:  Procedure Laterality Date   ABLATION     AORTIC VALVE REPLACEMENT     COLONOSCOPY WITH ESOPHAGOGASTRODUODENOSCOPY (EGD)  06/28/2016   [facility]   ESOPHAGOGASTRODUODENOSCOPY  12/04/2017   [facility].   PACEMAKER IMPLANT       A IV Location/Drains/Wounds Patient Lines/Drains/Airways Status     Active Line/Drains/Airways     Name Placement date Placement time Site Days   Peripheral IV 02/19/22 20 G Distal;Left;Posterior Forearm 02/19/22  --  Forearm  1   Peripheral IV 02/20/22 20 G Anterior;Right Forearm 02/20/22  0500  Forearm  less than 1            Intake/Output Last 24 hours No intake or output data in the 24 hours ending 02/20/22 2103  Labs/Imaging Results for  orders placed or performed during the hospital encounter of 02/19/22 (from the past 48 hour(s))  Basic metabolic panel     Status: Abnormal   Collection Time: 02/19/22  2:52 PM  Result Value Ref Range   Sodium 137 135 - 145 mmol/L   Potassium 4.2 3.5 - 5.1 mmol/L   Chloride 103 98 - 111 mmol/L   CO2 27 22 - 32 mmol/L   Glucose, Bld 110 (H) 70 - 99 mg/dL    Comment: Glucose reference range applies only to samples taken after fasting  for at least 8 hours.   BUN 8 8 - 23 mg/dL   Creatinine, Ser 1.07 0.61 - 1.24 mg/dL   Calcium 8.6 (L) 8.9 - 10.3 mg/dL   GFR, Estimated >60 >60 mL/min    Comment: (NOTE) Calculated using the CKD-EPI Creatinine Equation (2021)    Anion gap 7 5 - 15    Comment: Performed at [facility] [address] [place] [address]  CBC     Status: None   Collection Time: 02/19/22  2:52 PM  Result Value Ref Range   WBC 6.1 4.0 - 10.5 K/uL   RBC 4.58 4.22 - 5.81 MIL/uL   Hemoglobin 13.1 13.0 - 17.0 g/dL   HCT 41.9 39.0 - 52.0 %   MCV 91.5 80.0 - 100.0 fL   MCH 28.6 26.0 - 34.0 pg   MCHC 31.3 30.0 - 36.0 g/dL   RDW 15.1 11.5 - 15.5 %   Platelets 211 150 - 400 K/uL   nRBC 0.0 0.0 - 0.2 %    Comment: Performed at [facility], [place] [address] [place], [place] [address]  Troponin I (High Sensitivity)     Status: Abnormal   Collection Time: 02/19/22  2:52 PM  Result Value Ref Range   Troponin I (High Sensitivity) 86 (H) <18 ng/L    Comment: (NOTE) Elevated high sensitivity troponin I (hsTnI) values and significant  changes across serial measurements may suggest ACS but many other  chronic and acute conditions are known to elevate hsTnI results.  Refer to the "Links" section for chest pain algorithms and additional  guidance. Performed at [facility], [place] [address] [place], [place] [address]   Troponin I (High Sensitivity)     Status: Abnormal   Collection Time: 02/19/22  6:34 PM  Result Value Ref Range   Troponin I (High Sensitivity) 59 (H) <18 ng/L    Comment: DELTA CHECK NOTED (NOTE) Elevated high sensitivity troponin I (hsTnI) values and significant  changes across serial measurements may suggest ACS but many other  chronic and acute conditions are known to elevate hsTnI results.  Refer to the "Links" section for chest pain algorithms and additional  guidance. Performed at [facility], [place] [address], [place], [place] [address]   Magnesium      Status: None   Collection Time: 02/19/22  6:34 PM  Result Value Ref Range   Magnesium 1.7 1.7 - 2.4 mg/dL    Comment: Performed at [facility], [place] [address], [place], [place] [address]  Basic metabolic panel     Status: Abnormal   Collection Time: 02/20/22  4:33 AM  Result Value Ref Range   Sodium 135 135 - 145 mmol/L   Potassium 3.5 3.5 - 5.1 mmol/L   Chloride 102 98 - 111 mmol/L   CO2 26 22 - 32 mmol/L   Glucose, Bld 113 (H) 70 - 99 mg/dL  Comment: Glucose reference range applies only to samples taken after fasting for at least 8 hours.   BUN 10 8 - 23 mg/dL   Creatinine, Ser 1.00 0.61 - 1.24 mg/dL   Calcium 8.2 (L) 8.9 - 10.3 mg/dL   GFR, Estimated >60 >60 mL/min    Comment: (NOTE) Calculated using the CKD-EPI Creatinine Equation (2021)    Anion gap 7 5 - 15    Comment: Performed at [facility] [address], [place], [place] [address]  CBC     Status: Abnormal   Collection Time: 02/20/22  4:33 AM  Result Value Ref Range   WBC 4.5 4.0 - 10.5 K/uL   RBC 4.42 4.22 - 5.81 MIL/uL   Hemoglobin 12.7 (L) 13.0 - 17.0 g/dL   HCT 39.2 39.0 - 52.0 %   MCV 88.7 80.0 - 100.0 fL   MCH 28.7 26.0 - 34.0 pg   MCHC 32.4 30.0 - 36.0 g/dL   RDW 15.1 11.5 - 15.5 %   Platelets 183 150 - 400 K/uL   nRBC 0.0 0.0 - 0.2 %    Comment: Performed at [facility], [place] [address], [place], [place] [address]  Brain natriuretic peptide     Status: Abnormal   Collection Time: 02/20/22  4:33 AM  Result Value Ref Range   B Natriuretic Peptide 124.6 (H) 0.0 - 100.0 pg/mL    Comment: Performed at [facility] [address] [place], [place] [address]  Lipid panel     Status: None   Collection Time: 02/20/22  4:33 AM  Result Value Ref Range   Cholesterol 148 0 - 200 mg/dL   Triglycerides 105 <150 mg/dL   HDL 41 >40 mg/dL   Total CHOL/HDL Ratio 3.6 RATIO   VLDL 21 0 - 40 mg/dL   LDL Cholesterol 86 0 - 99 mg/dL    Comment:        Total Cholesterol/HDL:CHD  Risk Coronary Heart Disease Risk Table                     Men   Women  1/2 Average Risk   3.4   3.3  Average Risk       5.0   4.4  2 X Average Risk   9.6   7.1  3 X Average Risk  23.4   11.0        Use the calculated Patient Ratio above and the CHD Risk Table to determine the patient's CHD Risk.        ATP III CLASSIFICATION (LDL):  <100     mg/dL   Optimal  100-129  mg/dL   Near or Above                    Optimal  130-159  mg/dL   Borderline  160-189  mg/dL   High  >190     mg/dL   Very High Performed at [facility] [address], [place], [place] [address]   Heparin level (unfractionated)     Status: None   Collection Time: 02/20/22 11:00 AM  Result Value Ref Range   Heparin Unfractionated 0.49 0.30 - 0.70 IU/mL    Comment: (NOTE) The clinical reportable range upper limit is being lowered to >1.10 to align with the FDA approved guidance for the current laboratory assay.  If heparin results are below expected values, and patient dosage has  been confirmed, suggest follow  up testing of antithrombin III levels. Performed at [facility], [place] [address], [place], [place] [address]   APTT     Status: Abnormal   Collection Time: 02/20/22 11:00 AM  Result Value Ref Range   aPTT 73 (H) 24 - 36 seconds    Comment:        IF BASELINE aPTT IS ELEVATED, SUGGEST PATIENT RISK ASSESSMENT BE USED TO DETERMINE APPROPRIATE ANTICOAGULANT THERAPY. Performed at [facility], [place] [address] [place], [place] [address]   Troponin I (High Sensitivity)     Status: Abnormal   Collection Time: 02/20/22 12:12 PM  Result Value Ref Range   Troponin I (High Sensitivity) 19 (H) <18 ng/L    Comment: (NOTE) Elevated high sensitivity troponin I (hsTnI) values and significant  changes across serial measurements may suggest ACS but many other  chronic and acute conditions are known to elevate hsTnI results.  Refer to the "Links" section for chest pain algorithms and  additional  guidance. Performed at [facility], [place] [address] [place], [place] [address]   Protime-INR     Status: None   Collection Time: 02/20/22  1:28 PM  Result Value Ref Range   Prothrombin Time 14.4 11.4 - 15.2 seconds   INR 1.1 0.8 - 1.2    Comment: (NOTE) INR goal varies based on device and disease states. Performed at [facility], [place] [address] [place], [place] [address]   Troponin I (High Sensitivity)     Status: Abnormal   Collection Time: 02/20/22  7:51 PM  Result Value Ref Range   Troponin I (High Sensitivity) 20 (H) <18 ng/L    Comment: (NOTE) Elevated high sensitivity troponin I (hsTnI) values and significant  changes across serial measurements may suggest ACS but many other  chronic and acute conditions are known to elevate hsTnI results.  Refer to the "Links" section for chest pain algorithms and additional  guidance. Performed at [facility], [place] [address], [place], [place] [address]   Heparin level (unfractionated)     Status: None   Collection Time: 02/20/22  7:51 PM  Result Value Ref Range   Heparin Unfractionated 0.41 0.30 - 0.70 IU/mL    Comment: (NOTE) The clinical reportable range upper limit is being lowered to >1.10 to align with the FDA approved guidance for the current laboratory assay.  If heparin results are below expected values, and patient dosage has  been confirmed, suggest follow up testing of antithrombin III levels. Performed at [facility], [place] [address], [place], [place] [address]   APTT     Status: Abnormal   Collection Time: 02/20/22  7:51 PM  Result Value Ref Range   aPTT 74 (H) 24 - 36 seconds    Comment:        IF BASELINE aPTT IS ELEVATED, SUGGEST PATIENT RISK ASSESSMENT BE USED TO DETERMINE APPROPRIATE ANTICOAGULANT THERAPY. Performed at [facility], [place] [address], [place], [place] [address]    ECHOCARDIOGRAM COMPLETE  Result Date: 02/20/2022    ECHOCARDIOGRAM REPORT    Patient Name:   Juanpablo Ciresi. Date of Exam: 02/20/2022 Medical Rec #:  [id number]             Height:       73.0 in Accession #:    [id number]            Weight:       188.0 lb Date  of Birth:  1940-06-09            BSA:          2.096 m Patient Age:    89 years              BP:           132/81 mmHg Patient Gender: M                     HR:           80 bpm. Exam Location:  Inpatient Procedure: 2D Echo, Cardiac Doppler and Color Doppler Indications:    Chest pain  History:        Patient has prior history of Echocardiogram examinations, most                 recent 01/03/2022. Defibrillator, Aortic Valve Disease,                 Arrythmias:Atrial Fibrillation; Risk Factors:Hypertension. See                 cardiology note for info about last echo. S/p ablation. S/p TAVR                 valve.  Sonographer:    Clayton Lefort RDCS (AE) Referring Phys: [facility] NICOLE DUKE  Sonographer Comments: Suboptimal subcostal window. IMPRESSIONS  1. Left ventricular ejection fraction, by estimation, is 50 to 55%. The left ventricle has low normal function. The left ventricle has no regional wall motion abnormalities. There is moderate concentric left ventricular hypertrophy. Left ventricular diastolic parameters are indeterminate.  2. Right ventricular systolic function is normal. The right ventricular size is normal. Tricuspid regurgitation signal is inadequate for assessing PA pressure.  3. Left atrial size was moderately dilated.  4. The mitral valve is grossly normal. No evidence of mitral valve regurgitation. No evidence of mitral stenosis.  5. The aortic valve has been replaced with a TAVR valve with slightly low deployment. No MR or SAM. Aortic valve regurgitation is not visualized. Effective orifice area, by VTI measures 2.43 cm. Aortic valve mean gradient measures 5.0 mmHg.  6. Aortic dilatation noted. There is mild dilatation of the ascending aorta, measuring 44 mm. Comparison(s): No prior Echocardiogram. FINDINGS   Left Ventricle: Left ventricular ejection fraction, by estimation, is 50 to 55%. The left ventricle has low normal function. The left ventricle has no regional wall motion abnormalities. The left ventricular internal cavity size was normal in size. There is moderate concentric left ventricular hypertrophy. Left ventricular diastolic parameters are indeterminate. Right Ventricle: The right ventricular size is normal. No increase in right ventricular wall thickness. Right ventricular systolic function is normal. Tricuspid regurgitation signal is inadequate for assessing PA pressure. Left Atrium: Left atrial size was moderately dilated. Right Atrium: Right atrial size was normal in size. Pericardium: There is no evidence of pericardial effusion. Mitral Valve: The mitral valve is grossly normal. No evidence of mitral valve regurgitation. No evidence of mitral valve stenosis. Tricuspid Valve: The tricuspid valve is normal in structure. Tricuspid valve regurgitation is not demonstrated. No evidence of tricuspid stenosis. Aortic Valve: The aortic valve has been repaired/replaced. Aortic valve regurgitation is not visualized. Aortic valve mean gradient measures 5.0 mmHg. Aortic valve peak gradient measures 10.0 mmHg. Aortic valve area, by VTI measures 2.43 cm. Pulmonic Valve: The pulmonic valve was not well visualized. Pulmonic valve regurgitation is not visualized. No evidence of pulmonic stenosis. Aorta: Aortic dilatation noted.  There is mild dilatation of the ascending aorta, measuring 44 mm. IAS/Shunts: No atrial level shunt detected by color flow Doppler. Additional Comments: A device lead is visualized in the right ventricle and right atrium.  LEFT VENTRICLE PLAX 2D LVIDd:         5.00 cm LVIDs:         3.60 cm LV PW:         1.30 cm LV IVS:        1.40 cm LVOT diam:     2.50 cm LV SV:         66 LV SV Index:   31 LVOT Area:     4.91 cm  RIGHT VENTRICLE RV Basal diam:  3.40 cm RV S prime:     5.55 cm/s TAPSE  (M-mode): 1.0 cm LEFT ATRIUM           Index        RIGHT ATRIUM           Index LA diam:      5.90 cm 2.82 cm/m   RA Area:     23.10 cm LA Vol (A2C): 59.9 ml 28.58 ml/m  RA Volume:   59.80 ml  28.53 ml/m LA Vol (A4C): 85.2 ml 40.65 ml/m  AORTIC VALVE AV Area (Vmax):    2.08 cm AV Area (Vmean):   2.04 cm AV Area (VTI):     2.43 cm AV Vmax:           158.00 cm/s AV Vmean:          104.500 cm/s AV VTI:            0.270 m AV Peak Grad:      10.0 mmHg AV Mean Grad:      5.0 mmHg LVOT Vmax:         66.90 cm/s LVOT Vmean:        43.400 cm/s LVOT VTI:          0.134 m LVOT/AV VTI ratio: 0.50  AORTA Ao Root diam: 3.10 cm Ao Asc diam:  4.40 cm  SHUNTS Systemic VTI:  0.13 m Systemic Diam: 2.50 cm Rudean Haskell MD Electronically signed by Rudean Haskell MD Signature Date/Time: 02/20/2022/4:25:33 PM    Final    DG Chest 2 View  Result Date: 02/19/2022 CLINICAL DATA:  Chest pain. EXAM: CHEST - 2 VIEW COMPARISON:  Chest radiograph November 03, 2021 FINDINGS: Dual lead pacer apparatus overlies the left hemithorax, leads are stable in position. Left atrial appendage clip unchanged. Stent graft unchanged. Stable cardiac and mediastinal contours. Elevated right hemidiaphragm. Bibasilar heterogeneous opacities. No pleural effusion or pneumothorax. Thoracic spine degenerative changes. IMPRESSION: No active cardiopulmonary disease. Electronically Signed   By: Lovey Newcomer M.D.   On: 02/19/2022 15:38    Pending Labs Unresulted Labs (From admission, onward)     Start     Ordered   02/21/22 0500  CBC with Differential  Tomorrow morning,   R        02/20/22 1327   02/21/22 1610  Basic metabolic panel  Tomorrow morning,   R        02/20/22 1327   02/21/22 0500  Heparin level (unfractionated)  Daily at 5am,   R     See Hyperspace for full Linked Orders Report.   02/20/22 1517   02/21/22 0500  CBC  Daily at 5am,   R     See Hyperspace for full Linked Orders Report.  02/20/22 1517             Vitals/Pain Today's Vitals   02/20/22 1600 02/20/22 1930 02/20/22 2010 02/20/22 2012  BP: 121/83 117/84 111/71   Pulse: (!) 111 81 83   Resp: '16 19 15   '$ Temp: 97.9 F (36.6 C)   98.1 F (36.7 C)  TempSrc:    Oral  SpO2: 95% 94% 92%   PainSc:        Isolation Precautions No active isolations  Medications Medications  acetaminophen (TYLENOL) tablet 650 mg (has no administration in time range)  ondansetron (ZOFRAN) injection 4 mg (has no administration in time range)  metoprolol succinate (TOPROL-XL) 24 hr tablet 50 mg (50 mg Oral Given 02/20/22 1333)  sertraline (ZOLOFT) tablet 100 mg (100 mg Oral Given 02/20/22 1336)  temazepam (RESTORIL) capsule 15 mg (has no administration in time range)  clopidogrel (PLAVIX) tablet 75 mg (75 mg Oral Given 02/20/22 1335)  heparin ADULT infusion 100 units/mL (25000 units/251m) (1,300 Units/hr Intravenous New Bag/Given 02/20/22 2015)  lacosamide (VIMPAT) tablet 150 mg (150 mg Oral Given 02/20/22 1337)  0.9 %  sodium chloride infusion (has no administration in time range)    Mobility walks Low fall risk   Focused Assessments Neuro Assessment Handoff:  Swallow screen pass? Yes  Cardiac Rhythm: Ventricular paced, Atrial paced       Neuro Assessment:   Neuro Checks:      Last Documented NIHSS Modified Score:   Has TPA been given? No If patient is a Neuro Trauma and patient is going to OR before floor call report to [facility]: [phone] [phone]  R Recommendations: See Admitting Provider Note  Report given to:   Additional Notes: pt is AAOx4. Pt is on room air. Pt is ambulatory. Pt is using urinal.

## 2022-02-20 NOTE — Plan of Care (Signed)
  Problem: Education: Goal: Knowledge of General Education information will improve Description: Including pain rating scale, medication(s)/side effects and non-pharmacologic comfort measures Outcome: Progressing   Problem: Health Behavior/Discharge Planning: Goal: Ability to manage health-related needs will improve Outcome: Progressing   Problem: Pain Managment: Goal: General experience of comfort will improve Outcome: Progressing   Problem: Safety: Goal: Ability to remain free from injury will improve Outcome: Progressing   Problem: Cardiac: Goal: Ability to achieve and maintain adequate cardiopulmonary perfusion will improve Outcome: Progressing

## 2022-02-20 NOTE — Progress Notes (Signed)
ANTICOAGULATION CONSULT NOTE - Follow Up Consult  Pharmacy Consult for heparin Indication: atrial fibrillation  Allergies  Allergen Reactions   Codeine Other (See Comments)    Other reaction(s): Other - See Comments Chest pain  Chest pain  Chest pressure.  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    Tape     Other reaction(s): Other (comments) Blisters  Blisters, redness, itching PAPER TAPE PREFERRED    Gabapentin Nausea Only and Other (See Comments)    Other reaction(s): Nausea and/or vomiting-Intolerance dizziness dizziness    Morphine     Other reaction(s): Other - See Comments, Unknown Morphine doesn't work  Morphine doesn't work  "Does not work for me"    Simvastatin     Other reaction(s): Unknown    Vital Signs: Temp: 97.9 F (36.6 C) (10/10 1230) Temp Source: Oral (10/10 0316) BP: 132/81 (10/10 1333) Pulse Rate: 79 (10/10 1333)  Labs: Recent Labs    02/19/22 1452 02/19/22 1834 02/20/22 0433 02/20/22 1100 02/20/22 1212 02/20/22 1328  HGB 13.1  --  12.7*  --   --   --   HCT 41.9  --  39.2  --   --   --   PLT 211  --  183  --   --   --   APTT  --   --   --  73*  --   --   LABPROT  --   --   --   --   --  14.4  INR  --   --   --   --   --  1.1  HEPARINUNFRC  --   --   --  0.49  --   --   CREATININE 1.07  --  1.00  --   --   --   TROPONINIHS 86* 59*  --   --  19*  --     CrCl cannot be calculated (Unknown ideal weight.).  Medications:  Eliquis 5 mg BID (currently holding)  Assessment: Patient presented for chest pain with mildly elevated troponin. 1100 heparin level 0.49 and aPTT 73, both within therapeutic range. Appear to be correlated but will get one more aPTT level to confirm.   Goal of Therapy:  Heparin level 0.3-0.7  units/ml aPTT 66-102 seconds Monitor platelets by anticoagulation protocol: Yes   Plan:  Continue heparin 1300 units/hour  Obtain heparin level and aPTT in 8 hours Daily CBC and heparin level  Monitor for bleeding  Todd Argabright 02/20/2022,3:04 PM

## 2022-02-20 NOTE — Progress Notes (Signed)
Rounding Note    Patient Name: Billy Beck. Date of Encounter: 02/20/2022  [facility] Cardiologist: Quentin Ore for EP  Subjective   No SOB   Chest pressure (on and off0, mild  Inpatient Medications    Scheduled Meds:  clopidogrel  75 mg Oral Daily   lacosamide  150 mg Oral BID   metoprolol succinate  50 mg Oral Daily   sertraline  100 mg Oral BID   Continuous Infusions:  heparin 1,300 Units/hr (02/20/22 0227)   PRN Meds: acetaminophen, ondansetron (ZOFRAN) IV, temazepam   Vital Signs    Vitals:   02/20/22 1000 02/20/22 1100 02/20/22 1215 02/20/22 1230  BP: 109/74 133/77 137/88 (!) 140/80  Pulse: 72 75 73 78  Resp: '13 16 15 '$ (!) 21  Temp:    97.9 F (36.6 C)  TempSrc:      SpO2: 94% 100% 97% 98%   No intake or output data in the 24 hours ending 02/20/22 1251    12/07/2021    1:43 PM 11/23/2021    9:15 AM 11/01/2021    3:08 PM  Last 3 Weights  Weight (lbs) 188 lb 186 lb 188 lb 2 oz  Weight (kg) 85.276 kg 84.369 kg 85.333 kg      Telemetry    Afib   80s    - Personally Reviewed  ECG    No new  - Personally Reviewed  Physical Exam   GEN: No acute distress.   Neck: No JVD Cardiac: irreg irreg   Gr II/VI sysotlic murmur base  Respiratory: Clear to auscultation bilaterally. GI: Soft, nontender, non-distended  MS: No edema; No deformity. Neuro:  Nonfocal  Psych: Normal affect   Labs    High Sensitivity Troponin:   Recent Labs  Lab 02/19/22 1452 02/19/22 1834  TROPONINIHS 86* 59*     Chemistry Recent Labs  Lab 02/19/22 1452 02/19/22 1834 02/20/22 0433  NA 137  --  135  K 4.2  --  3.5  CL 103  --  102  CO2 27  --  26  GLUCOSE 110*  --  113*  BUN 8  --  10  CREATININE 1.07  --  1.00  CALCIUM 8.6*  --  8.2*  MG  --  1.7  --   GFRNONAA >60  --  >60  ANIONGAP 7  --  7    Lipids  Recent Labs  Lab 02/20/22 0433  CHOL 148  TRIG 105  HDL 41  LDLCALC 86  CHOLHDL 3.6    Hematology Recent Labs  Lab 02/19/22 1452  02/20/22 0433  WBC 6.1 4.5  RBC 4.58 4.42  HGB 13.1 12.7*  HCT 41.9 39.2  MCV 91.5 88.7  MCH 28.6 28.7  MCHC 31.3 32.4  RDW 15.1 15.1  PLT 211 183   Thyroid No results for input(s): "TSH", "FREET4" in the last 168 hours.  BNP Recent Labs  Lab 02/20/22 0433  BNP 124.6*    DDimer No results for input(s): "DDIMER" in the last 168 hours.   Radiology    DG Chest 2 View  Result Date: 02/19/2022 CLINICAL DATA:  Chest pain. EXAM: CHEST - 2 VIEW COMPARISON:  Chest radiograph November 03, 2021 FINDINGS: Dual lead pacer apparatus overlies the left hemithorax, leads are stable in position. Left atrial appendage clip unchanged. Stent graft unchanged. Stable cardiac and mediastinal contours. Elevated right hemidiaphragm. Bibasilar heterogeneous opacities. No pleural effusion or pneumothorax. Thoracic spine degenerative changes. IMPRESSION: No active cardiopulmonary  disease. Electronically Signed   By: Lovey Newcomer M.D.   On: 02/19/2022 15:38    Cardiac Studies   Echo ordered today               12/2021   Technically difficult study.    The left ventricular systolic function is normal (55-65%).    Unable to assess left ventricular diastolic function (paced rhythm).    The right ventricular systolic function is normal.    The left atrium is severely dilated.    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 The prosthetic valve  function is normal.    Mild mitral valve regurgitation.        This result has an attachment that is not available.    Resting ECG: indicated a paced rhythm.    Stress ECG: non diagnostic.    Protocol: Pharmacologic using regadenoson    Hemodynamic Response: adequate.    Symptoms: shortness of breath.    Left ventricle cavity size is normal.    LV Function: EF is 63%. LV function is normal. The wall motion is  normal.    Left ventricular perfusion: normal.    Low risk study    LHC 2015 20-30 percent in the proximal and mid LAD; 30% in the  circumflex; 20-30% in the proximal and mid RCA. (2012 and cath 10/2013)  Patient Profile     81 y.o. male with hx PAF, Hx of VT (s/p ablation), TAVR, HTN, presents with chest discomfort and afib with RVR.    Assessment & Plan    1  Chest pressure   Pt with complaints of chesty pain   He points to epigastrum and also L chest    Started yesterday   was intense when came in    Now mild, on and off. Very minor elevation of troponin   Will repeat  Will get limited echo to evaluate LVEF and wall motion  Chest discomfort was felt to be due to afib   Now ? Could he have ischemia     He had a cath in [place] prior to TAVR.   Mild CAD in 2015         Continue heparin for now   2  PAF   Pt with hx of PAF   (CHADSVASc 4; has bled on elqluis   )    Rates improved    Histogram shows atrial fibrillation new.    He missed 2 doses of Eliquis over past 2 weeks      Plan tentatively for TEE and then cardioversion  if eval of chest discomfort does not lead in other direction Need to consider antiarrhythmic Rx     3   Hx TAVR  2012   [place]   last echo noted above from Aug 2023  4  Hx VT   S/P ablation x 2   Pt with dual chamber St Jude  ICD in 2017    5  Hx HTN   BP is controlled    For questions or updates, please contact [place] Please consult [email or web address] for contact info under        Signed, Dorris Carnes, MD  02/20/2022, 12:51 PM

## 2022-02-20 NOTE — Progress Notes (Signed)
ANTICOAGULATION CONSULT NOTE - Follow Up Consult  Pharmacy Consult for heparin Indication: atrial fibrillation  Allergies  Allergen Reactions   Codeine Other (See Comments)    Other reaction(s): Other - See Comments Chest pain  Chest pain  Chest pressure.  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    Tape     Other reaction(s): Other (comments) Blisters  Blisters, redness, itching PAPER TAPE PREFERRED    Gabapentin Nausea Only and Other (See Comments)    Other reaction(s): Nausea and/or vomiting-Intolerance dizziness dizziness    Morphine     Other reaction(s): Other - See Comments, Unknown Morphine doesn't work  Morphine doesn't work  "Does not work for me"    Simvastatin     Other reaction(s): Unknown    Vital Signs: Temp: 98.1 F (36.7 C) (10/10 2012) Temp Source: Oral (10/10 2012) BP: 111/71 (10/10 2010) Pulse Rate: 83 (10/10 2010)  Labs: Recent Labs    02/19/22 1452 02/19/22 1834 02/20/22 0433 02/20/22 1100 02/20/22 1212 02/20/22 1328 02/20/22 1951  HGB 13.1  --  12.7*  --   --   --   --   HCT 41.9  --  39.2  --   --   --   --   PLT 211  --  183  --   --   --   --   APTT  --   --   --  73*  --   --  74*  LABPROT  --   --   --   --   --  14.4  --   INR  --   --   --   --   --  1.1  --   HEPARINUNFRC  --   --   --  0.49  --   --  0.41  CREATININE 1.07  --  1.00  --   --   --   --   TROPONINIHS 86* 59*  --   --  19*  --   --      CrCl cannot be calculated (Unknown ideal weight.).  Medications:  Eliquis 5 mg BID (currently holding)  Assessment: Patient presented for chest pain with mildly elevated troponin.   Heparin level and aPTT both within goal range this evening on heparin at 1300 units/hr.  No overt bleeding or  complications noted.  Goal of Therapy:  Heparin level 0.3-0.7 units/ml aPTT 66-102 seconds Monitor platelets by anticoagulation protocol: Yes   Plan:  Continue heparin 1300 units/hour  Daily CBC and heparin level  Monitor for bleeding  Marguerite Olea, BCCP Clinical Pharmacist  02/20/2022 8:35 PM   [facility] pharmacy phone numbers are listed on [email or web address]

## 2022-02-21 ENCOUNTER — Observation Stay (HOSPITAL_BASED_OUTPATIENT_CLINIC_OR_DEPARTMENT_OTHER): Payer: Medicare Other

## 2022-02-21 ENCOUNTER — Observation Stay (HOSPITAL_COMMUNITY): Payer: Medicare Other | Admitting: Anesthesiology

## 2022-02-21 ENCOUNTER — Encounter (HOSPITAL_COMMUNITY): Payer: Self-pay | Admitting: Cardiovascular Disease

## 2022-02-21 ENCOUNTER — Observation Stay (HOSPITAL_BASED_OUTPATIENT_CLINIC_OR_DEPARTMENT_OTHER): Payer: Medicare Other | Admitting: Anesthesiology

## 2022-02-21 ENCOUNTER — Encounter (HOSPITAL_COMMUNITY)
Admission: EM | Disposition: A | Discharge status: Home / Self Care | Payer: Self-pay | Source: Home / Self Care | Attending: Emergency Medicine

## 2022-02-21 DIAGNOSIS — Z7901 Long term (current) use of anticoagulants: Secondary | ICD-10-CM | POA: Diagnosis not present

## 2022-02-21 DIAGNOSIS — I361 Nonrheumatic tricuspid (valve) insufficiency: Secondary | ICD-10-CM

## 2022-02-21 DIAGNOSIS — E119 Type 2 diabetes mellitus without complications: Secondary | ICD-10-CM | POA: Diagnosis not present

## 2022-02-21 DIAGNOSIS — I480 Paroxysmal atrial fibrillation: Secondary | ICD-10-CM | POA: Diagnosis not present

## 2022-02-21 DIAGNOSIS — I4891 Unspecified atrial fibrillation: Secondary | ICD-10-CM

## 2022-02-21 DIAGNOSIS — Z7902 Long term (current) use of antithrombotics/antiplatelets: Secondary | ICD-10-CM | POA: Diagnosis not present

## 2022-02-21 DIAGNOSIS — I340 Nonrheumatic mitral (valve) insufficiency: Secondary | ICD-10-CM | POA: Diagnosis not present

## 2022-02-21 DIAGNOSIS — Z79899 Other long term (current) drug therapy: Secondary | ICD-10-CM | POA: Diagnosis not present

## 2022-02-21 DIAGNOSIS — I10 Essential (primary) hypertension: Secondary | ICD-10-CM | POA: Diagnosis not present

## 2022-02-21 HISTORY — PX: TEE WITHOUT CARDIOVERSION: SHX5443

## 2022-02-21 HISTORY — PX: CARDIOVERSION: SHX1299

## 2022-02-21 LAB — CBC WITH DIFFERENTIAL/PLATELET
Abs Immature Granulocytes: 0.02 10*3/uL (ref 0.00–0.07)
Basophils Absolute: 0 10*3/uL (ref 0.0–0.1)
Basophils Relative: 1 %
Eosinophils Absolute: 0.4 10*3/uL (ref 0.0–0.5)
Eosinophils Relative: 6 %
HCT: 41.8 % (ref 39.0–52.0)
Hemoglobin: 13.6 g/dL (ref 13.0–17.0)
Immature Granulocytes: 0 %
Lymphocytes Relative: 22 %
Lymphs Abs: 1.5 10*3/uL (ref 0.7–4.0)
MCH: 28.7 pg (ref 26.0–34.0)
MCHC: 32.5 g/dL (ref 30.0–36.0)
MCV: 88.2 fL (ref 80.0–100.0)
Monocytes Absolute: 0.6 10*3/uL (ref 0.1–1.0)
Monocytes Relative: 8 %
Neutro Abs: 4.1 10*3/uL (ref 1.7–7.7)
Neutrophils Relative %: 63 %
Platelets: 183 10*3/uL (ref 150–400)
RBC: 4.74 MIL/uL (ref 4.22–5.81)
RDW: 15.1 % (ref 11.5–15.5)
WBC: 6.6 10*3/uL (ref 4.0–10.5)
nRBC: 0 % (ref 0.0–0.2)

## 2022-02-21 LAB — BASIC METABOLIC PANEL
Anion gap: 8 (ref 5–15)
BUN: 11 mg/dL (ref 8–23)
CO2: 27 mmol/L (ref 22–32)
Calcium: 9 mg/dL (ref 8.9–10.3)
Chloride: 104 mmol/L (ref 98–111)
Creatinine, Ser: 1.06 mg/dL (ref 0.61–1.24)
GFR, Estimated: >60 mL/min (ref >60)
Glucose, Bld: 111 mg/dL — ABNORMAL HIGH (ref 70–99)
Potassium: 4.2 mmol/L (ref 3.5–5.1)
Sodium: 139 mmol/L (ref 135–145)

## 2022-02-21 LAB — HEPARIN LEVEL (UNFRACTIONATED): Heparin Unfractionated: 0.32 IU/mL (ref 0.30–0.70)

## 2022-02-21 SURGERY — ECHOCARDIOGRAM, TRANSESOPHAGEAL
Anesthesia: General

## 2022-02-21 MED ORDER — CHOLESTYRAMINE 4 G PO PACK
4.0000 g | PACK | Freq: Every day | ORAL | Status: DC
  Administered 2022-02-22: 4 g via ORAL
  Filled 2022-02-21: qty 1

## 2022-02-21 MED ORDER — PROPOFOL 500 MG/50ML IV EMUL
INTRAVENOUS | Status: DC | PRN
  Administered 2022-02-21: 15 mg via INTRAVENOUS
  Administered 2022-02-21: 150 ug/kg/min via INTRAVENOUS
  Administered 2022-02-21 (×2): 25 mg via INTRAVENOUS

## 2022-02-21 MED ORDER — LIDOCAINE 2% (20 MG/ML) 5 ML SYRINGE
INTRAMUSCULAR | Status: DC | PRN
  Administered 2022-02-21: 40 mg via INTRAVENOUS

## 2022-02-21 MED ORDER — APIXABAN 5 MG PO TABS
5.0000 mg | ORAL_TABLET | Freq: Two times a day (BID) | ORAL | Status: DC
  Administered 2022-02-21 – 2022-02-22 (×2): 5 mg via ORAL
  Filled 2022-02-21 (×2): qty 1

## 2022-02-21 NOTE — Interval H&P Note (Signed)
History and Physical Interval Note:  02/21/2022 1:31 PM  Billy Beck.  has presented today for surgery, with the diagnosis of atrial fibrilllation.  The various methods of treatment have been discussed with the patient and family. After consideration of risks, benefits and other options for treatment, the patient has consented to  Procedure(s): TRANSESOPHAGEAL ECHOCARDIOGRAM (TEE) (N/A) CARDIOVERSION (N/A) as a surgical intervention.  The patient's history has been reviewed, patient examined, no change in status, stable for surgery.  I have reviewed the patient's chart and labs.  Questions were answered to the patient's satisfaction.     [employer]

## 2022-02-21 NOTE — Anesthesia Preprocedure Evaluation (Addendum)
Anesthesia Evaluation  Patient identified by MRN, date of birth, ID band Patient awake    Reviewed: Allergy & Precautions, H&P , NPO status , Patient's Chart, lab work & pertinent test results, reviewed documented beta blocker date and time   Airway Mallampati: II  TM Distance: >3 FB Neck ROM: Full    Dental no notable dental hx. (+) Edentulous Upper, Partial Lower, Dental Advisory Given   Pulmonary former smoker   Pulmonary exam normal breath sounds clear to auscultation       Cardiovascular hypertension, Pt. on medications and Pt. on home beta blockers + dysrhythmias Atrial Fibrillation  Rhythm:Irregular Rate:Normal  S/p TAVR   Neuro/Psych Seizures -, Well Controlled,   Anxiety Depression       GI/Hepatic negative GI ROS, Neg liver ROS,,,  Endo/Other  diabetes    Renal/GU negative Renal ROS  negative genitourinary   Musculoskeletal  (+) Arthritis , Osteoarthritis,    Abdominal   Peds  Hematology negative hematology ROS (+)   Anesthesia Other Findings   Reproductive/Obstetrics negative OB ROS                             Anesthesia Physical Anesthesia Plan  ASA: 3  Anesthesia Plan: General   Post-op Pain Management: Minimal or no pain anticipated   Induction: Intravenous  PONV Risk Score and Plan: 2 and Propofol infusion and Treatment may vary due to age or medical condition  Airway Management Planned: Natural Airway and Nasal Cannula  Additional Equipment:   Intra-op Plan:   Post-operative Plan:   Informed Consent: I have reviewed the patients History and Physical, chart, labs and discussed the procedure including the risks, benefits and alternatives for the proposed anesthesia with the patient or authorized representative who has indicated his/her understanding and acceptance.     Dental advisory given  Plan Discussed with: CRNA  Anesthesia Plan Comments:         Anesthesia Quick Evaluation

## 2022-02-21 NOTE — CV Procedure (Signed)
   Transesophageal Echocardiogram  Indications: Atrial fibrillation, Saint Jude device  Time out performed  During this procedure the patient was administered propofol under anesthesiology supervision to achieve and maintain moderate sedation.  The patient's heart rate, blood pressure, and oxygen saturation are monitored continuously during the procedure.   Findings:  Left Ventricle: EF low normal 50 to 55%  Mitral Valve: Trace mitral regurgitation  Aortic Valve: Trileaflet valve no stenosis no regurg  Tricuspid Valve: Mild tricuspid regurgitation  Left Atrium: Normal, no evidence of thrombus.  Right Atrium: Normal  Intraatrial septum: Centralized circular connection between left and right atrium likely from prior ablation catheter.  Left to right shunt noted via color Doppler.  3D utilized for illustration.  Bubble Contrast Study: Not performed  Candee Furbish, MD      Electrical Cardioversion Procedure Note Jakeel Starliper [id number] 1941/05/07  Procedure: Electrical Cardioversion Indications:  Atrial Fibrillation  Time Out: Verified patient identification, verified procedure,medications/allergies/relevent history reviewed, required imaging and test results available.  Performed  Procedure Details  The patient was NPO after midnight. Anesthesia was administered at the beside anesthesia with propofol.  Cardioversion was performed with synchronized biphasic defibrillation via AP pads with 200 joules.  1 attempt(s) were performed.  The patient converted to normal sinus rhythm. The patient tolerated the procedure well   IMPRESSION:  Successful cardioversion of atrial fibrillation.  Confirmed with [facility] representative, interrogation of device.  Discussed with Eda Paschal via telephone.    Eathen Budreau 02/21/2022, 2:10 PM

## 2022-02-21 NOTE — Anesthesia Procedure Notes (Signed)
Procedure Name: MAC Date/Time: 02/21/2022 1:40 PM  Performed by: Barrington Ellison, CRNAPre-anesthesia Checklist: Patient identified, Emergency Drugs available, Suction available, Patient being monitored and Timeout performed Patient Re-evaluated:Patient Re-evaluated prior to induction Oxygen Delivery Method: Nasal cannula

## 2022-02-21 NOTE — H&P (View-Only) (Signed)
Rounding Note    Patient Name: Billy Beck. Date of Encounter: 02/21/2022  [facility] Cardiologist: Quentin Ore for EP  Subjective   No chest pressure today   No SOB at rest    Inpatient Medications    Scheduled Meds:  cholestyramine  4 g Oral Daily   clopidogrel  75 mg Oral Daily   lacosamide  150 mg Oral BID   metoprolol succinate  50 mg Oral Daily   sertraline  100 mg Oral BID   Continuous Infusions:  sodium chloride 20 mL/hr at 02/21/22 0534   heparin 1,300 Units/hr (02/20/22 2015)   PRN Meds: acetaminophen, nitroGLYCERIN, ondansetron (ZOFRAN) IV, temazepam   Vital Signs    Vitals:   02/20/22 2154 02/20/22 2347 02/21/22 0441 02/21/22 0536  BP: 129/87 114/73  104/67  Pulse: 79 83  79  Resp: '19 19  19  '$ Temp: 97.8 F (36.6 C) 97.8 F (36.6 C)  98.6 F (37 C)  TempSrc: Oral Oral  Axillary  SpO2: 96% 96%  95%  Weight: 83 kg  83 kg   Height: '6\' 1"'$  (1.854 m)       Intake/Output Summary (Last 24 hours) at 02/21/2022 0956 Last data filed at 02/20/2022 2300 Gross per 24 hour  Intake 240 ml  Output --  Net 240 ml      02/21/2022    4:41 AM 02/20/2022    9:54 PM 12/07/2021    1:43 PM  Last 3 Weights  Weight (lbs) 183 lb 183 lb 188 lb  Weight (kg) 83.008 kg 83.008 kg 85.276 kg      Telemetry    Afib  70s to 100  - Personally Reviewed  ECG    No new  - Personally Reviewed  Physical Exam   GEN: Pt is a 81 yo in no acute distress.   Neck: No JVD Cardiac: irreg irreg   Gr II/VI sysotlic murmur base  Respiratory: Clear to auscultation bilaterally. GI: Soft, nontender, non-distended  MS: No edema; No deformity. Neuro:  Nonfocal  Psych: Normal affect   Labs    High Sensitivity Troponin:   Recent Labs  Lab 02/19/22 1452 02/19/22 1834 02/20/22 1212 02/20/22 1951  TROPONINIHS 86* 59* 19* 20*     Chemistry Recent Labs  Lab 02/19/22 1452 02/19/22 1834 02/20/22 0433 02/21/22 0700  NA 137  --  135 139  K 4.2  --  3.5 4.2   CL 103  --  102 104  CO2 27  --  26 27  GLUCOSE 110*  --  113* 111*  BUN 8  --  10 11  CREATININE 1.07  --  1.00 1.06  CALCIUM 8.6*  --  8.2* 9.0  MG  --  1.7  --   --   GFRNONAA >60  --  >60 >60  ANIONGAP 7  --  7 8    Lipids  Recent Labs  Lab 02/20/22 0433  CHOL 148  TRIG 105  HDL 41  LDLCALC 86  CHOLHDL 3.6    Hematology Recent Labs  Lab 02/19/22 1452 02/20/22 0433 02/21/22 0700  WBC 6.1 4.5 6.6  RBC 4.58 4.42 4.74  HGB 13.1 12.7* 13.6  HCT 41.9 39.2 41.8  MCV 91.5 88.7 88.2  MCH 28.6 28.7 28.7  MCHC 31.3 32.4 32.5  RDW 15.1 15.1 15.1  PLT 211 183 183   Thyroid No results for input(s): "TSH", "FREET4" in the last 168 hours.  BNP Recent Labs  Lab  02/20/22 0433  BNP 124.6*    DDimer No results for input(s): "DDIMER" in the last 168 hours.   Radiology    ECHOCARDIOGRAM COMPLETE  Result Date: 02/20/2022    ECHOCARDIOGRAM REPORT   Patient Name:   Billy Beck. Date of Exam: 02/20/2022 Medical Rec #:  [id number]             Height:       73.0 in Accession #:    [id number]            Weight:       188.0 lb Date of Birth:  05-08-1941            BSA:          2.096 m Patient Age:    11 years              BP:           132/81 mmHg Patient Gender: M                     HR:           80 bpm. Exam Location:  Inpatient Procedure: 2D Echo, Cardiac Doppler and Color Doppler Indications:    Chest pain  History:        Patient has prior history of Echocardiogram examinations, most                 recent 01/03/2022. Defibrillator, Aortic Valve Disease,                 Arrythmias:Atrial Fibrillation; Risk Factors:Hypertension. See                 cardiology note for info about last echo. S/p ablation. S/p TAVR                 valve.  Sonographer:    Clayton Lefort RDCS (AE) Referring Phys: [facility] NICOLE DUKE  Sonographer Comments: Suboptimal subcostal window. IMPRESSIONS  1. Left ventricular ejection fraction, by estimation, is 50 to 55%. The left ventricle has low normal  function. The left ventricle has no regional wall motion abnormalities. There is moderate concentric left ventricular hypertrophy. Left ventricular diastolic parameters are indeterminate.  2. Right ventricular systolic function is normal. The right ventricular size is normal. Tricuspid regurgitation signal is inadequate for assessing PA pressure.  3. Left atrial size was moderately dilated.  4. The mitral valve is grossly normal. No evidence of mitral valve regurgitation. No evidence of mitral stenosis.  5. The aortic valve has been replaced with a TAVR valve with slightly low deployment. No MR or SAM. Aortic valve regurgitation is not visualized. Effective orifice area, by VTI measures 2.43 cm. Aortic valve mean gradient measures 5.0 mmHg.  6. Aortic dilatation noted. There is mild dilatation of the ascending aorta, measuring 44 mm. Comparison(s): No prior Echocardiogram. FINDINGS  Left Ventricle: Left ventricular ejection fraction, by estimation, is 50 to 55%. The left ventricle has low normal function. The left ventricle has no regional wall motion abnormalities. The left ventricular internal cavity size was normal in size. There is moderate concentric left ventricular hypertrophy. Left ventricular diastolic parameters are indeterminate. Right Ventricle: The right ventricular size is normal. No increase in right ventricular wall thickness. Right ventricular systolic function is normal. Tricuspid regurgitation signal is inadequate for assessing PA pressure. Left Atrium: Left atrial size was moderately dilated. Right Atrium: Right atrial size was normal in size. Pericardium: There is no evidence  of pericardial effusion. Mitral Valve: The mitral valve is grossly normal. No evidence of mitral valve regurgitation. No evidence of mitral valve stenosis. Tricuspid Valve: The tricuspid valve is normal in structure. Tricuspid valve regurgitation is not demonstrated. No evidence of tricuspid stenosis. Aortic Valve: The  aortic valve has been repaired/replaced. Aortic valve regurgitation is not visualized. Aortic valve mean gradient measures 5.0 mmHg. Aortic valve peak gradient measures 10.0 mmHg. Aortic valve area, by VTI measures 2.43 cm. Pulmonic Valve: The pulmonic valve was not well visualized. Pulmonic valve regurgitation is not visualized. No evidence of pulmonic stenosis. Aorta: Aortic dilatation noted. There is mild dilatation of the ascending aorta, measuring 44 mm. IAS/Shunts: No atrial level shunt detected by color flow Doppler. Additional Comments: A device lead is visualized in the right ventricle and right atrium.  LEFT VENTRICLE PLAX 2D LVIDd:         5.00 cm LVIDs:         3.60 cm LV PW:         1.30 cm LV IVS:        1.40 cm LVOT diam:     2.50 cm LV SV:         66 LV SV Index:   31 LVOT Area:     4.91 cm  RIGHT VENTRICLE RV Basal diam:  3.40 cm RV S prime:     5.55 cm/s TAPSE (M-mode): 1.0 cm LEFT ATRIUM           Index        RIGHT ATRIUM           Index LA diam:      5.90 cm 2.82 cm/m   RA Area:     23.10 cm LA Vol (A2C): 59.9 ml 28.58 ml/m  RA Volume:   59.80 ml  28.53 ml/m LA Vol (A4C): 85.2 ml 40.65 ml/m  AORTIC VALVE AV Area (Vmax):    2.08 cm AV Area (Vmean):   2.04 cm AV Area (VTI):     2.43 cm AV Vmax:           158.00 cm/s AV Vmean:          104.500 cm/s AV VTI:            0.270 m AV Peak Grad:      10.0 mmHg AV Mean Grad:      5.0 mmHg LVOT Vmax:         66.90 cm/s LVOT Vmean:        43.400 cm/s LVOT VTI:          0.134 m LVOT/AV VTI ratio: 0.50  AORTA Ao Root diam: 3.10 cm Ao Asc diam:  4.40 cm  SHUNTS Systemic VTI:  0.13 m Systemic Diam: 2.50 cm Rudean Haskell MD Electronically signed by Rudean Haskell MD Signature Date/Time: 02/20/2022/4:25:33 PM    Final    DG Chest 2 View  Result Date: 02/19/2022 CLINICAL DATA:  Chest pain. EXAM: CHEST - 2 VIEW COMPARISON:  Chest radiograph November 03, 2021 FINDINGS: Dual lead pacer apparatus overlies the left hemithorax, leads are stable in  position. Left atrial appendage clip unchanged. Stent graft unchanged. Stable cardiac and mediastinal contours. Elevated right hemidiaphragm. Bibasilar heterogeneous opacities. No pleural effusion or pneumothorax. Thoracic spine degenerative changes. IMPRESSION: No active cardiopulmonary disease. Electronically Signed   By: Lovey Newcomer M.D.   On: 02/19/2022 15:38    Cardiac Studies   Echo ordered today  12/2021   Technically difficult study.    The left ventricular systolic function is normal (55-65%).    Unable to assess left ventricular diastolic function (paced rhythm).    The right ventricular systolic function is normal.    The left atrium is severely dilated.    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 The prosthetic valve  function is normal.    Mild mitral valve regurgitation.        This result has an attachment that is not available.    Resting ECG: indicated a paced rhythm.    Stress ECG: non diagnostic.    Protocol: Pharmacologic using regadenoson    Hemodynamic Response: adequate.    Symptoms: shortness of breath.    Left ventricle cavity size is normal.    LV Function: EF is 63%. LV function is normal. The wall motion is  normal.    Left ventricular perfusion: normal.    Low risk study    LHC 2015 20-30 percent in the proximal and mid LAD; 30% in the circumflex; 20-30% in the proximal and mid RCA. (2012 and cath 10/2013)  Patient Profile     81 y.o. male with hx PAF, Hx of VT (s/p ablation), TAVR, HTN, presents with chest discomfort and afib with RVR.    Assessment & Plan    1  Chest pressure   Pt with complaints of chesty pain   He points to epigastrum and also L chest    Started yesterday   was intense when came in    Now mild, on and off. Very minor elevation of troponin   Echo with no discrete wall motion abnormalities      I think chest discomfort probably due to afib with RVR and not activie ischemia  Cath in [place]  prior  to TAVR showed very mild CAD (2015 )        2  PAF   Pt with hx of PAF   (CHADSVASc 4; has bled on elqluis   )    Rates improved    Histogram shows atrial fibrillation new.    He missed 2 doses of Eliquis over past 2 weeks      Plan tentatively for TEE and then cardioversion  if eval of chest discomfort does not lead in other direction Need to consider antiarrhythmic Rx      3   Hx TAVR  2012  in [place]  [place]   Echo yestrday shoed meang gradient 5 mm Hg      4  Hx VT   S/P ablation x 2   Pt with dual chamber St Jude  ICD in 2017    5  Hx HTN   BP is controlled    For questions or updates, please contact [place] Please consult [email or web address] for contact info under        Signed, Dorris Carnes, MD  02/21/2022, 9:56 AM

## 2022-02-21 NOTE — Progress Notes (Signed)
Rounding Note    Patient Name: Billy Beck. Date of Encounter: 02/21/2022  [facility] Cardiologist: Quentin Ore for EP  Subjective   No chest pressure today   No SOB at rest    Inpatient Medications    Scheduled Meds:  cholestyramine  4 g Oral Daily   clopidogrel  75 mg Oral Daily   lacosamide  150 mg Oral BID   metoprolol succinate  50 mg Oral Daily   sertraline  100 mg Oral BID   Continuous Infusions:  sodium chloride 20 mL/hr at 02/21/22 0534   heparin 1,300 Units/hr (02/20/22 2015)   PRN Meds: acetaminophen, nitroGLYCERIN, ondansetron (ZOFRAN) IV, temazepam   Vital Signs    Vitals:   02/20/22 2154 02/20/22 2347 02/21/22 0441 02/21/22 0536  BP: 129/87 114/73  104/67  Pulse: 79 83  79  Resp: '19 19  19  '$ Temp: 97.8 F (36.6 C) 97.8 F (36.6 C)  98.6 F (37 C)  TempSrc: Oral Oral  Axillary  SpO2: 96% 96%  95%  Weight: 83 kg  83 kg   Height: '6\' 1"'$  (1.854 m)       Intake/Output Summary (Last 24 hours) at 02/21/2022 0956 Last data filed at 02/20/2022 2300 Gross per 24 hour  Intake 240 ml  Output --  Net 240 ml      02/21/2022    4:41 AM 02/20/2022    9:54 PM 12/07/2021    1:43 PM  Last 3 Weights  Weight (lbs) 183 lb 183 lb 188 lb  Weight (kg) 83.008 kg 83.008 kg 85.276 kg      Telemetry    Afib  70s to 100  - Personally Reviewed  ECG    No new  - Personally Reviewed  Physical Exam   GEN: Pt is a 81 yo in no acute distress.   Neck: No JVD Cardiac: irreg irreg   Gr II/VI sysotlic murmur base  Respiratory: Clear to auscultation bilaterally. GI: Soft, nontender, non-distended  MS: No edema; No deformity. Neuro:  Nonfocal  Psych: Normal affect   Labs    High Sensitivity Troponin:   Recent Labs  Lab 02/19/22 1452 02/19/22 1834 02/20/22 1212 02/20/22 1951  TROPONINIHS 86* 59* 19* 20*     Chemistry Recent Labs  Lab 02/19/22 1452 02/19/22 1834 02/20/22 0433 02/21/22 0700  NA 137  --  135 139  K 4.2  --  3.5 4.2   CL 103  --  102 104  CO2 27  --  26 27  GLUCOSE 110*  --  113* 111*  BUN 8  --  10 11  CREATININE 1.07  --  1.00 1.06  CALCIUM 8.6*  --  8.2* 9.0  MG  --  1.7  --   --   GFRNONAA >60  --  >60 >60  ANIONGAP 7  --  7 8    Lipids  Recent Labs  Lab 02/20/22 0433  CHOL 148  TRIG 105  HDL 41  LDLCALC 86  CHOLHDL 3.6    Hematology Recent Labs  Lab 02/19/22 1452 02/20/22 0433 02/21/22 0700  WBC 6.1 4.5 6.6  RBC 4.58 4.42 4.74  HGB 13.1 12.7* 13.6  HCT 41.9 39.2 41.8  MCV 91.5 88.7 88.2  MCH 28.6 28.7 28.7  MCHC 31.3 32.4 32.5  RDW 15.1 15.1 15.1  PLT 211 183 183   Thyroid No results for input(s): "TSH", "FREET4" in the last 168 hours.  BNP Recent Labs  Lab  02/20/22 0433  BNP 124.6*    DDimer No results for input(s): "DDIMER" in the last 168 hours.   Radiology    ECHOCARDIOGRAM COMPLETE  Result Date: 02/20/2022    ECHOCARDIOGRAM REPORT   Patient Name:   Billy Beck. Date of Exam: 02/20/2022 Medical Rec #:  [id number]             Height:       73.0 in Accession #:    [id number]            Weight:       188.0 lb Date of Birth:  [birth year]            BSA:          2.096 m Patient Age:    64 years              BP:           132/81 mmHg Patient Gender: M                     HR:           80 bpm. Exam Location:  Inpatient Procedure: 2D Echo, Cardiac Doppler and Color Doppler Indications:    Chest pain  History:        Patient has prior history of Echocardiogram examinations, most                 recent 01/03/2022. Defibrillator, Aortic Valve Disease,                 Arrythmias:Atrial Fibrillation; Risk Factors:Hypertension. See                 cardiology note for info about last echo. S/p ablation. S/p TAVR                 valve.  Sonographer:    Clayton Lefort RDCS (AE) Referring Phys: [facility] NICOLE DUKE  Sonographer Comments: Suboptimal subcostal window. IMPRESSIONS  1. Left ventricular ejection fraction, by estimation, is 50 to 55%. The left ventricle has low normal  function. The left ventricle has no regional wall motion abnormalities. There is moderate concentric left ventricular hypertrophy. Left ventricular diastolic parameters are indeterminate.  2. Right ventricular systolic function is normal. The right ventricular size is normal. Tricuspid regurgitation signal is inadequate for assessing PA pressure.  3. Left atrial size was moderately dilated.  4. The mitral valve is grossly normal. No evidence of mitral valve regurgitation. No evidence of mitral stenosis.  5. The aortic valve has been replaced with a TAVR valve with slightly low deployment. No MR or SAM. Aortic valve regurgitation is not visualized. Effective orifice area, by VTI measures 2.43 cm. Aortic valve mean gradient measures 5.0 mmHg.  6. Aortic dilatation noted. There is mild dilatation of the ascending aorta, measuring 44 mm. Comparison(s): No prior Echocardiogram. FINDINGS  Left Ventricle: Left ventricular ejection fraction, by estimation, is 50 to 55%. The left ventricle has low normal function. The left ventricle has no regional wall motion abnormalities. The left ventricular internal cavity size was normal in size. There is moderate concentric left ventricular hypertrophy. Left ventricular diastolic parameters are indeterminate. Right Ventricle: The right ventricular size is normal. No increase in right ventricular wall thickness. Right ventricular systolic function is normal. Tricuspid regurgitation signal is inadequate for assessing PA pressure. Left Atrium: Left atrial size was moderately dilated. Right Atrium: Right atrial size was normal in size. Pericardium: There is no evidence  of pericardial effusion. Mitral Valve: The mitral valve is grossly normal. No evidence of mitral valve regurgitation. No evidence of mitral valve stenosis. Tricuspid Valve: The tricuspid valve is normal in structure. Tricuspid valve regurgitation is not demonstrated. No evidence of tricuspid stenosis. Aortic Valve: The  aortic valve has been repaired/replaced. Aortic valve regurgitation is not visualized. Aortic valve mean gradient measures 5.0 mmHg. Aortic valve peak gradient measures 10.0 mmHg. Aortic valve area, by VTI measures 2.43 cm. Pulmonic Valve: The pulmonic valve was not well visualized. Pulmonic valve regurgitation is not visualized. No evidence of pulmonic stenosis. Aorta: Aortic dilatation noted. There is mild dilatation of the ascending aorta, measuring 44 mm. IAS/Shunts: No atrial level shunt detected by color flow Doppler. Additional Comments: A device lead is visualized in the right ventricle and right atrium.  LEFT VENTRICLE PLAX 2D LVIDd:         5.00 cm LVIDs:         3.60 cm LV PW:         1.30 cm LV IVS:        1.40 cm LVOT diam:     2.50 cm LV SV:         66 LV SV Index:   31 LVOT Area:     4.91 cm  RIGHT VENTRICLE RV Basal diam:  3.40 cm RV S prime:     5.55 cm/s TAPSE (M-mode): 1.0 cm LEFT ATRIUM           Index        RIGHT ATRIUM           Index LA diam:      5.90 cm 2.82 cm/m   RA Area:     23.10 cm LA Vol (A2C): 59.9 ml 28.58 ml/m  RA Volume:   59.80 ml  28.53 ml/m LA Vol (A4C): 85.2 ml 40.65 ml/m  AORTIC VALVE AV Area (Vmax):    2.08 cm AV Area (Vmean):   2.04 cm AV Area (VTI):     2.43 cm AV Vmax:           158.00 cm/s AV Vmean:          104.500 cm/s AV VTI:            0.270 m AV Peak Grad:      10.0 mmHg AV Mean Grad:      5.0 mmHg LVOT Vmax:         66.90 cm/s LVOT Vmean:        43.400 cm/s LVOT VTI:          0.134 m LVOT/AV VTI ratio: 0.50  AORTA Ao Root diam: 3.10 cm Ao Asc diam:  4.40 cm  SHUNTS Systemic VTI:  0.13 m Systemic Diam: 2.50 cm Rudean Haskell MD Electronically signed by Rudean Haskell MD Signature Date/Time: 02/20/2022/4:25:33 PM    Final    DG Chest 2 View  Result Date: 02/19/2022 CLINICAL DATA:  Chest pain. EXAM: CHEST - 2 VIEW COMPARISON:  Chest radiograph November 03, 2021 FINDINGS: Dual lead pacer apparatus overlies the left hemithorax, leads are stable in  position. Left atrial appendage clip unchanged. Stent graft unchanged. Stable cardiac and mediastinal contours. Elevated right hemidiaphragm. Bibasilar heterogeneous opacities. No pleural effusion or pneumothorax. Thoracic spine degenerative changes. IMPRESSION: No active cardiopulmonary disease. Electronically Signed   By: Lovey Newcomer M.D.   On: 02/19/2022 15:38    Cardiac Studies   Echo ordered today  12/2021   Technically difficult study.    The left ventricular systolic function is normal (55-65%).    Unable to assess left ventricular diastolic function (paced rhythm).    The right ventricular systolic function is normal.    The left atrium is severely dilated.    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 The prosthetic valve  function is normal.    Mild mitral valve regurgitation.        This result has an attachment that is not available.    Resting ECG: indicated a paced rhythm.    Stress ECG: non diagnostic.    Protocol: Pharmacologic using regadenoson    Hemodynamic Response: adequate.    Symptoms: shortness of breath.    Left ventricle cavity size is normal.    LV Function: EF is 63%. LV function is normal. The wall motion is  normal.    Left ventricular perfusion: normal.    Low risk study    LHC 2015 20-30 percent in the proximal and mid LAD; 30% in the circumflex; 20-30% in the proximal and mid RCA. (2012 and cath 10/2013)  Patient Profile     81 y.o. male with hx PAF, Hx of VT (s/p ablation), TAVR, HTN, presents with chest discomfort and afib with RVR.    Assessment & Plan    1  Chest pressure   Pt with complaints of chesty pain   He points to epigastrum and also L chest    Started yesterday   was intense when came in    Now mild, on and off. Very minor elevation of troponin   Echo with no discrete wall motion abnormalities      I think chest discomfort probably due to afib with RVR and not activie ischemia  Cath in [place]  prior  to TAVR showed very mild CAD (2015 )        2  PAF   Pt with hx of PAF   (CHADSVASc 4; has bled on elqluis   )    Rates improved    Histogram shows atrial fibrillation new.    He missed 2 doses of Eliquis over past 2 weeks      Plan tentatively for TEE and then cardioversion  if eval of chest discomfort does not lead in other direction Need to consider antiarrhythmic Rx      3   Hx TAVR  2012  in [place]   Echo yestrday shoed meang gradient 5 mm Hg      4  Hx VT   S/P ablation x 2   Pt with dual chamber [employer]  ICD in 2017    5  Hx HTN   BP is controlled    For questions or updates, please contact [place] Please consult [email or web address] for contact info under        Signed, Dorris Carnes, MD  02/21/2022, 9:56 AM

## 2022-02-21 NOTE — Progress Notes (Signed)
  Echocardiogram Echocardiogram Transesophageal has been performed.  Billy Beck 02/21/2022, 2:34 PM

## 2022-02-21 NOTE — Transfer of Care (Signed)
Immediate Anesthesia Transfer of Care Note  Patient: Billy Beck.  Procedure(s) Performed: TRANSESOPHAGEAL ECHOCARDIOGRAM (TEE) CARDIOVERSION  Patient Location: Endoscopy Unit  Anesthesia Type:MAC  Level of Consciousness: drowsy and patient cooperative  Airway & Oxygen Therapy: Patient Spontanous Breathing  Post-op Assessment: Report given to RN  Post vital signs: Reviewed and stable  Last Vitals:  Vitals Value Taken Time  BP 101/60 02/21/22 1412  Temp    Pulse 70 02/21/22 1413  Resp 15 02/21/22 1413  SpO2 97 % 02/21/22 1413  Vitals shown include unvalidated device data.  Last Pain:  Vitals:   02/21/22 1242  TempSrc: Temporal  PainSc: 0-No pain      Patients Stated Pain Goal: 0 (43/88/87 5797)  Complications: No notable events documented.

## 2022-02-21 NOTE — Progress Notes (Addendum)
ANTICOAGULATION CONSULT NOTE - Follow Up Consult  Pharmacy Consult for heparin Indication: atrial fibrillation  Allergies  Allergen Reactions   Codeine Other (See Comments)    Other reaction(s): Other - See Comments Chest pain  Chest pain  Chest pressure.  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    Tape     Other reaction(s): Other (comments) Blisters  Blisters, redness, itching PAPER TAPE PREFERRED    Gabapentin Nausea Only and Other (See Comments)    Other reaction(s): Nausea and/or vomiting-Intolerance dizziness dizziness    Morphine     Other reaction(s): Other - See Comments, Unknown Morphine doesn't work  Morphine doesn't work  "Does not work for me"    Simvastatin     Other reaction(s): Unknown    Vital Signs: Temp: 97.6 F (36.4 C) (10/11 1413) Temp Source: Temporal (10/11 1413) BP: 113/72 (10/11 1430) Pulse Rate: 70 (10/11 1430)  Labs: Recent Labs    02/19/22 1452 02/19/22 1834 02/20/22 0433 02/20/22 1100 02/20/22 1212 02/20/22 1328 02/20/22 1951 02/21/22 0700  HGB 13.1  --  12.7*  --   --   --   --  13.6  HCT 41.9  --  39.2  --   --   --   --  41.8  PLT 211  --  183  --   --   --   --  183  APTT  --   --   --  73*  --   --  74*  --   LABPROT  --   --   --   --   --  14.4  --   --   INR  --   --   --   --   --  1.1  --   --   HEPARINUNFRC  --   --   --  0.49  --   --  0.41 0.32  CREATININE 1.07  --  1.00  --   --   --   --  1.06  TROPONINIHS 86* 59*  --   --  19*  --  20*  --      Estimated Creatinine Clearance: 64.9 mL/min (by C-G formula based on SCr of 1.06 mg/dL).   Assessment: 25 yoM on apixaban PTA admitted with AF and CP. Pharmacy consulted to dose IV heparin. -he is s/p DCCV and plans are to change to  apixapan -he is noted on plavix.   Goal of Therapy:  Heparin level 0.3-0.7 units/ml aPTT 66-102 seconds Monitor platelets by anticoagulation protocol: Yes   Plan:  -Start apixaban '5mg'$  po bid tonight -stop heparin when apixaban starts -Discontinue plavix ( Discussed with Dr. Harrington Challenger)  Hildred Laser, PharmD Clinical Pharmacist **Pharmacist phone directory can now be found on [email or web address] ([email or web address]).  Listed under [place].

## 2022-02-21 NOTE — Anesthesia Postprocedure Evaluation (Signed)
Anesthesia Post Note  Patient: Dawan Farney.  Procedure(s) Performed: TRANSESOPHAGEAL ECHOCARDIOGRAM (TEE) CARDIOVERSION     Patient location during evaluation: Endoscopy Anesthesia Type: General Level of consciousness: awake and alert Pain management: pain level controlled Vital Signs Assessment: post-procedure vital signs reviewed and stable Respiratory status: spontaneous breathing, nonlabored ventilation and respiratory function stable Cardiovascular status: blood pressure returned to baseline and stable Postop Assessment: no apparent nausea or vomiting Anesthetic complications: no  No notable events documented.  Last Vitals:  Vitals:   02/21/22 1420 02/21/22 1430  BP: 107/71 113/72  Pulse: 70 70  Resp: (!) 21 (!) 22  Temp:    SpO2: 94% 94%    Last Pain:  Vitals:   02/21/22 1430  TempSrc:   PainSc: 0-No pain                 Ndidi Nesby,W. EDMOND

## 2022-02-21 NOTE — Progress Notes (Signed)
ANTICOAGULATION CONSULT NOTE - Follow Up Consult  Pharmacy Consult for heparin Indication: atrial fibrillation  Allergies  Allergen Reactions   Codeine Other (See Comments)    Other reaction(s): Other - See Comments Chest pain  Chest pain  Chest pressure.  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    Tape     Other reaction(s): Other (comments) Blisters  Blisters, redness, itching PAPER TAPE PREFERRED    Gabapentin Nausea Only and Other (See Comments)    Other reaction(s): Nausea and/or vomiting-Intolerance dizziness dizziness    Morphine     Other reaction(s): Other - See Comments, Unknown Morphine doesn't work  Morphine doesn't work  "Does not work for me"    Simvastatin     Other reaction(s): Unknown    Vital Signs: Temp: 98.6 F (37 C) (10/11 0536) Temp Source: Axillary (10/11 0536) BP: 104/67 (10/11 0536) Pulse Rate: 79 (10/11 0536)  Labs: Recent Labs    02/19/22 1452 02/19/22 1834 02/20/22 0433 02/20/22 1100 02/20/22 1212 02/20/22 1328 02/20/22 1951 02/21/22 0700  HGB 13.1  --  12.7*  --   --   --   --  13.6  HCT 41.9  --  39.2  --   --   --   --  41.8  PLT 211  --  183  --   --   --   --  183  APTT  --   --   --  73*  --   --  74*  --   LABPROT  --   --   --   --   --  14.4  --   --   INR  --   --   --   --   --  1.1  --   --   HEPARINUNFRC  --   --   --  0.49  --   --  0.41 0.32  CREATININE 1.07  --  1.00  --   --   --   --   --   TROPONINIHS 86* 59*  --   --  19*  --  20*  --      Estimated Creatinine Clearance: 68.8 mL/min (by C-G formula based on SCr of 1 mg/dL).   Assessment: 55 yoM on apixaban PTA admitted with AF and CP. Pharmacy consulted to start IV heparin.  Heparin level therapeutic (correlating with aPTTs),  CBC stable. Possible TEE/DCCV today.  Goal of Therapy:  Heparin level 0.3-0.7 units/ml aPTT 66-102 seconds Monitor platelets by anticoagulation protocol: Yes   Plan:  Continue heparin 1300 units/hour  Daily CBC and heparin level  Monitor for bleeding  Arrie Senate, PharmD, BCPS, [facility] Clinical Pharmacist [phone] Please check AMION for all [facility] Pharmacy numbers 02/21/2022

## 2022-02-22 DIAGNOSIS — I480 Paroxysmal atrial fibrillation: Secondary | ICD-10-CM | POA: Diagnosis not present

## 2022-02-22 DIAGNOSIS — Z7902 Long term (current) use of antithrombotics/antiplatelets: Secondary | ICD-10-CM | POA: Diagnosis not present

## 2022-02-22 DIAGNOSIS — Z7901 Long term (current) use of anticoagulants: Secondary | ICD-10-CM | POA: Diagnosis not present

## 2022-02-22 DIAGNOSIS — Z79899 Other long term (current) drug therapy: Secondary | ICD-10-CM | POA: Diagnosis not present

## 2022-02-22 LAB — CBC
HCT: 37.6 % — ABNORMAL LOW (ref 39.0–52.0)
Hemoglobin: 12.5 g/dL — ABNORMAL LOW (ref 13.0–17.0)
MCH: 29.3 pg (ref 26.0–34.0)
MCHC: 33.2 g/dL (ref 30.0–36.0)
MCV: 88.1 fL (ref 80.0–100.0)
Platelets: 191 10*3/uL (ref 150–400)
RBC: 4.27 MIL/uL (ref 4.22–5.81)
RDW: 15.3 % (ref 11.5–15.5)
WBC: 6.3 10*3/uL (ref 4.0–10.5)
nRBC: 0 % (ref 0.0–0.2)

## 2022-02-22 NOTE — Progress Notes (Signed)
Mobility Specialist - Progress Note   02/22/22 1055  Mobility  Activity Ambulated with assistance in hallway  Level of Assistance Standby assist, set-up cues, supervision of patient - no hands on  Assistive Device None  Distance Ambulated (ft) 470 ft  Activity Response Tolerated well  Mobility Referral Yes  $Mobility charge 1 Mobility   Pt received in bed and agreeable. No complaints throughout. Pt returned to EOB with all needs met.  Larey Seat

## 2022-02-22 NOTE — Progress Notes (Addendum)
Rounding Note    Patient Name: Billy Beck. Date of Encounter: 02/22/2022  [place] Cardiologist: Dorris Carnes, MD [place] Electrophysiologist: Vickie Epley, MD   Subjective   Had some chest pressure last pm, no change w/ deep inspiration, no chest wall tenderness, better when laying on R side  Inpatient Medications    Scheduled Meds:  apixaban  5 mg Oral BID   cholestyramine  4 g Oral QAC lunch   lacosamide  150 mg Oral BID   metoprolol succinate  50 mg Oral Daily   sertraline  100 mg Oral BID   Continuous Infusions:   PRN Meds: acetaminophen, nitroGLYCERIN, ondansetron (ZOFRAN) IV, temazepam   Vital Signs    Vitals:   02/21/22 1430 02/21/22 2241 02/22/22 0359 02/22/22 0500  BP: 113/72  (!) 141/76   Pulse: 70     Resp: (!) 22  20   Temp:  97.9 F (36.6 C) 97.9 F (36.6 C)   TempSrc:  Oral Oral   SpO2: 94%  98%   Weight:    83.7 kg  Height:        Intake/Output Summary (Last 24 hours) at 02/22/2022 1610 Last data filed at 02/21/2022 1406 Gross per 24 hour  Intake 250 ml  Output --  Net 250 ml      02/22/2022    5:00 AM 02/21/2022    4:41 AM 02/20/2022    9:54 PM  Last 3 Weights  Weight (lbs) 184 lb 8.4 oz 183 lb 183 lb  Weight (kg) 83.7 kg 83.008 kg 83.008 kg      Telemetry    Afib >> AV pacing, occ fusion beats - Personally Reviewed  ECG    No new  - Personally Reviewed  Physical Exam   General: Well developed, well nourished, male in no acute distress Head: Eyes PERRLA, Head normocephalic and atraumatic Lungs: clear bilaterally to auscultation. Heart: HRRR S1 S2, without rub or gallop. soft murmur. 4/4 extremity pulses are 2+ & equal. No JVD. Abdomen: Bowel sounds are present, abdomen soft and non-tender without masses or  hernias noted. Msk: Normal strength and tone for age. Extremities: No clubbing, cyanosis or edema.    Skin:  No rashes or lesions noted. Neuro: Alert and oriented X 3. Psych:  Good  affect, responds appropriately  Labs    High Sensitivity Troponin:   Recent Labs  Lab 02/19/22 1452 02/19/22 1834 02/20/22 1212 02/20/22 1951  TROPONINIHS 86* 59* 19* 20*     Chemistry Recent Labs  Lab 02/19/22 1452 02/19/22 1834 02/20/22 0433 02/21/22 0700  NA 137  --  135 139  K 4.2  --  3.5 4.2  CL 103  --  102 104  CO2 27  --  26 27  GLUCOSE 110*  --  113* 111*  BUN 8  --  10 11  CREATININE 1.07  --  1.00 1.06  CALCIUM 8.6*  --  8.2* 9.0  MG  --  1.7  --   --   GFRNONAA >60  --  >60 >60  ANIONGAP 7  --  7 8    Lipids  Recent Labs  Lab 02/20/22 0433  CHOL 148  TRIG 105  HDL 41  LDLCALC 86  CHOLHDL 3.6    Hematology Recent Labs  Lab 02/20/22 0433 02/21/22 0700 02/22/22 0140  WBC 4.5 6.6 6.3  RBC 4.42 4.74 4.27  HGB 12.7* 13.6 12.5*  HCT 39.2 41.8 37.6*  MCV  88.7 88.2 88.1  MCH 28.7 28.7 29.3  MCHC 32.4 32.5 33.2  RDW 15.1 15.1 15.3  PLT 183 183 191   Thyroid No results for input(s): "TSH", "FREET4" in the last 168 hours.  BNP Recent Labs  Lab 02/20/22 0433  BNP 124.6*    DDimer No results for input(s): "DDIMER" in the last 168 hours.   Radiology    ECHOCARDIOGRAM COMPLETE  Result Date: 02/20/2022    ECHOCARDIOGRAM REPORT   Patient Name:   Billy Beck. Date of Exam: 02/20/2022 Medical Rec #:  [id number]             Height:       73.0 in Accession #:    [id number]            Weight:       188.0 lb Date of Birth:  1940-06-14            BSA:          2.096 m Patient Age:    81 years              BP:           132/81 mmHg Patient Gender: M                     HR:           80 bpm. Exam Location:  Inpatient Procedure: 2D Echo, Cardiac Doppler and Color Doppler Indications:    Chest pain  History:        Patient has prior history of Echocardiogram examinations, most                 recent 01/03/2022. Defibrillator, Aortic Valve Disease,                 Arrythmias:Atrial Fibrillation; Risk Factors:Hypertension. See                 cardiology  note for info about last echo. S/p ablation. S/p TAVR                 valve.  Sonographer:    Clayton Lefort RDCS (AE) Referring Phys: [facility] NICOLE DUKE  Sonographer Comments: Suboptimal subcostal window. IMPRESSIONS  1. Left ventricular ejection fraction, by estimation, is 50 to 55%. The left ventricle has low normal function. The left ventricle has no regional wall motion abnormalities. There is moderate concentric left ventricular hypertrophy. Left ventricular diastolic parameters are indeterminate.  2. Right ventricular systolic function is normal. The right ventricular size is normal. Tricuspid regurgitation signal is inadequate for assessing PA pressure.  3. Left atrial size was moderately dilated.  4. The mitral valve is grossly normal. No evidence of mitral valve regurgitation. No evidence of mitral stenosis.  5. The aortic valve has been replaced with a TAVR valve with slightly low deployment. No MR or SAM. Aortic valve regurgitation is not visualized. Effective orifice area, by VTI measures 2.43 cm. Aortic valve mean gradient measures 5.0 mmHg.  6. Aortic dilatation noted. There is mild dilatation of the ascending aorta, measuring 44 mm. Comparison(s): No prior Echocardiogram. FINDINGS  Left Ventricle: Left ventricular ejection fraction, by estimation, is 50 to 55%. The left ventricle has low normal function. The left ventricle has no regional wall motion abnormalities. The left ventricular internal cavity size was normal in size. There is moderate concentric left ventricular hypertrophy. Left ventricular diastolic parameters are indeterminate. Right Ventricle: The right ventricular size is normal. No  increase in right ventricular wall thickness. Right ventricular systolic function is normal. Tricuspid regurgitation signal is inadequate for assessing PA pressure. Left Atrium: Left atrial size was moderately dilated. Right Atrium: Right atrial size was normal in size. Pericardium: There is no evidence of  pericardial effusion. Mitral Valve: The mitral valve is grossly normal. No evidence of mitral valve regurgitation. No evidence of mitral valve stenosis. Tricuspid Valve: The tricuspid valve is normal in structure. Tricuspid valve regurgitation is not demonstrated. No evidence of tricuspid stenosis. Aortic Valve: The aortic valve has been repaired/replaced. Aortic valve regurgitation is not visualized. Aortic valve mean gradient measures 5.0 mmHg. Aortic valve peak gradient measures 10.0 mmHg. Aortic valve area, by VTI measures 2.43 cm. Pulmonic Valve: The pulmonic valve was not well visualized. Pulmonic valve regurgitation is not visualized. No evidence of pulmonic stenosis. Aorta: Aortic dilatation noted. There is mild dilatation of the ascending aorta, measuring 44 mm. IAS/Shunts: No atrial level shunt detected by color flow Doppler. Additional Comments: A device lead is visualized in the right ventricle and right atrium.  LEFT VENTRICLE PLAX 2D LVIDd:         5.00 cm LVIDs:         3.60 cm LV PW:         1.30 cm LV IVS:        1.40 cm LVOT diam:     2.50 cm LV SV:         66 LV SV Index:   31 LVOT Area:     4.91 cm  RIGHT VENTRICLE RV Basal diam:  3.40 cm RV S prime:     5.55 cm/s TAPSE (M-mode): 1.0 cm LEFT ATRIUM           Index        RIGHT ATRIUM           Index LA diam:      5.90 cm 2.82 cm/m   RA Area:     23.10 cm LA Vol (A2C): 59.9 ml 28.58 ml/m  RA Volume:   59.80 ml  28.53 ml/m LA Vol (A4C): 85.2 ml 40.65 ml/m  AORTIC VALVE AV Area (Vmax):    2.08 cm AV Area (Vmean):   2.04 cm AV Area (VTI):     2.43 cm AV Vmax:           158.00 cm/s AV Vmean:          104.500 cm/s AV VTI:            0.270 m AV Peak Grad:      10.0 mmHg AV Mean Grad:      5.0 mmHg LVOT Vmax:         66.90 cm/s LVOT Vmean:        43.400 cm/s LVOT VTI:          0.134 m LVOT/AV VTI ratio: 0.50  AORTA Ao Root diam: 3.10 cm Ao Asc diam:  4.40 cm  SHUNTS Systemic VTI:  0.13 m Systemic Diam: 2.50 cm Rudean Haskell MD  Electronically signed by Rudean Haskell MD Signature Date/Time: 02/20/2022/4:25:33 PM    Final     Cardiac Studies   Echo 02/20/2022  1. Left ventricular ejection fraction, by estimation, is 50 to 55%. The left ventricle has low normal function. The left ventricle has no regional wall motion abnormalities. There is moderate concentric left ventricular hypertrophy. Left ventricular diastolic parameters are indeterminate.   2. Right ventricular systolic function is normal. The right ventricular size  is normal. Tricuspid regurgitation signal is inadequate for assessing PA pressure.   3. Left atrial size was moderately dilated.   4. The mitral valve is grossly normal. No evidence of mitral valve regurgitation. No evidence of mitral stenosis.   5. The aortic valve has been replaced with a TAVR valve with slightly low deployment. No MR or SAM. Aortic valve regurgitation is not visualized. Effective orifice area, by VTI measures 2.43 cm. Aortic valve mean gradient measures 5.0 mmHg.   6. Aortic dilatation noted. There is mild dilatation of the ascending aorta, measuring 44 mm.    Transesophageal Echocardiogram 02/21/2022  Indications: Atrial fibrillation, Saint Jude device   During this procedure the patient was administered propofol under anesthesiology supervision to achieve and maintain moderate sedation.  The patient's heart rate, blood pressure, and oxygen saturation are monitored continuously during the procedure.    Findings:   Left Ventricle: EF low normal 50 to 55%   Mitral Valve: Trace mitral regurgitation   Aortic Valve: Trileaflet valve no stenosis no regurg   Tricuspid Valve: Mild tricuspid regurgitation   Left Atrium: Normal, no evidence of thrombus.   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  Left to right shunt noted via color Doppler.  3D utilized for illustration.    Bubble Contrast Study: Not performed  Procedure: Electrical Cardioversion Indications:  Atrial Fibrillation   Time Out: Verified patient identification, verified procedure,medications/allergies/relevent history reviewed, required imaging and test results available.  Performed   Procedure Details   The patient was NPO after midnight. Anesthesia was administered at the beside anesthesia with propofol.  Cardioversion was performed with synchronized biphasic defibrillation via AP pads with 200 joules.  1 attempt(s) were performed.  The patient converted to normal sinus rhythm. The patient tolerated the procedure well    IMPRESSION:   Successful cardioversion of atrial fibrillation.  Confirmed with [facility] representative, interrogation of device.   Discussed with Eda Paschal via telephone.       Candee Furbish 02/21/2022, 2:10 PM                 12/2021   Technically difficult study.    The left ventricular systolic function is normal (55-65%).    Unable to assess left ventricular diastolic function (paced rhythm).    The right ventricular systolic function is normal.    The left atrium is severely dilated.    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 The prosthetic valve  function is normal.    Mild mitral valve regurgitation.        This result has an attachment that is not available.    Resting ECG: indicated a paced rhythm.    Stress ECG: non diagnostic.    Protocol: Pharmacologic using regadenoson    Hemodynamic Response: adequate.    Symptoms: shortness of breath.    Left ventricle cavity size is normal.    LV Function: EF is 63%. LV function is normal. The wall motion is  normal.    Left ventricular perfusion: normal.    Low risk study    LHC 2015 20-30 percent in the proximal and mid LAD; 30% in the circumflex; 20-30% in the proximal and mid RCA. (2012 and cath 10/2013)  Patient Profile     81 y.o. male with hx PAF, Hx of VT (s/p ablation),  TAVR, HTN, presents with chest discomfort and afib with RVR.    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 - recurrent pain last pm, better laying on R side, not exertional - MD advise on further workup  2  Persistent AF   - new dx - since had missed 2 doses Eliquis needed TEE/DCCV, which was successful - continue Eliquis  - he is on Toprol XL 50 mg qd but SBP 92 this am, MD advise if dose change needed   3   Hx TAVR  2012  in [place] -   Echo 10/10 showed mean gradient 5 mm Hg      4  Hx VT   - S/P ablation x 2   - S/p dual chamber St Jude  ICD in 2017    5  Hx HTN   - BP is a little low this am, generally controlled   Ambulate and see how tolerated, probably d/c today  For questions or updates, please contact [place] Please consult [email or web address] for contact info under     Signed, Rosaria Ferries, PA-C  02/22/2022, 8:33 AM    ADDENDUM:  Shortly after I saw him, he sat up and had L chest pain, sharp and pressure. He was given SL NTG x 2 by his nurse, but he said the pain was starting to resolve before he got them.  ECG is unchanged CP has resolved.  Rosaria Ferries, PA-C 02/22/2022 10:53 AM  Patient seen and examined   I agree with findings as noted by R Barrett above    Pt had an episode of chest pressure taht lasted about 10 min this am   Occurred while sitting in bed  before breakfast   Eased to 0    He was able to walk in hall without problem  ON exam   Lungs are CTA Cardiac RRR  II/VI systolic murmur base Ext are without edema   1  Afib  TEE / DCCV yesterday   maintaining SR   I have reviewed with C Quentin Ore   Would reocmm following   Keep on b blocker and Eliquis     2 Chest pressure   Atypical   Left heart cath was 8 years ago    Will follow   Ambulate again today  With recent cardioversion do not  want to stop anticoagulation      Empiric PPI for now

## 2022-02-22 NOTE — Progress Notes (Signed)
Returned to see patient    Feels good   He was on a walk earlier   Did fine  Plan  D/C today on same meds    I have reviewed with Grayce Sessions (EP) who follows him.in clinic   Would follow for now and watch for recurrence.  RE chest pressure   Atypical   Started PPI   Would recomm Rx for SL NTG to use prn Will follow   I am not convinced ischemia  Set pt up to see me in about 4 weeks   Pt will use my chart for problmes sooner   Dorris Carnes MD

## 2022-02-22 NOTE — Care Management (Signed)
  Transition of Care [facility]) Screening Note   Patient Details  Name: Billy Beck. Date of Birth: 24-Mar-1941   Transition of Care [facility] CM/SW Contact:    Adelfa Koh Ocie Cornfield, RN Phone Number: 02/22/2022, 11:58 AM    Transition of Care Department [facility]) has reviewed the patient and no TOC needs have been identified at this time. We will continue to monitor patient advancement through interdisciplinary progression rounds. If new patient transition needs arise, please place a TOC consult.

## 2022-02-22 NOTE — Discharge Summary (Signed)
Discharge Summary    Patient ID: Billy Beck. MRN: [id number]; DOB: [birth year]  Admit date: 02/19/2022 Discharge date: 02/22/2022  PCP:  Horald Pollen, [place] [facility] Cardiologist:  Dorris Carnes, MD  Electrophysiologist:  Vickie Epley, MD       Discharge Diagnoses    Principal Problem:   Atrial fibrillation Billy Beck) Active Problems:   Diet-controlled diabetes mellitus ([place])   ICD (implantable cardioverter-defibrillator) in place   Primary hypertension   Chronic diastolic heart failure ([place])    Diagnostic Studies/Procedures    Echo 02/20/2022  1. Left ventricular ejection fraction, by estimation, is 50 to 55%. The  left ventricle has low normal function. The left ventricle has no regional  wall motion abnormalities. There is moderate concentric left ventricular  hypertrophy. Left ventricular  diastolic parameters are indeterminate.   2. Right ventricular systolic function is normal. The right ventricular  size is normal. Tricuspid regurgitation signal is inadequate for assessing  PA pressure.   3. Left atrial size was moderately dilated.   4. The mitral valve is grossly normal. No evidence of mitral valve  regurgitation. No evidence of mitral stenosis.   5. The aortic valve has been replaced with a TAVR valve with slightly low  deployment. No MR or SAM. Aortic valve regurgitation is not visualized.  Effective orifice area, by VTI measures 2.43 cm. Aortic valve mean  gradient measures 5.0 mmHg.   6. Aortic dilatation noted. There is mild dilatation of the ascending  aorta, measuring 44 mm.   Comparison(s): No prior Echocardiogram.    TEE DCCV 02/21/2022 1. Post afib ablation prior septal catheter orifice noted with left to  right shunting.   2. Left ventricular ejection fraction, by estimation, is 50 to 55%. The  left ventricle has low normal function.   3. Right ventricular systolic function is normal. The right  ventricular  size is normal.   4. Left atrial size was moderately dilated. No left atrial/left atrial  appendage thrombus was detected.   5. Right atrial size was mildly dilated.   6. The mitral valve is normal in structure. Mild mitral valve  regurgitation.   7. The aortic valve has been repaired/replaced. Aortic valve  regurgitation is not visualized. There is a TAVR valve present in the  aortic position. Procedure Date: 2012. Echo findings are consistent with  normal structure and function of the aortic valve   prosthesis.   8. 44 mm noted on TTE. Aortic dilatation noted. There is mild dilatation  of the ascending aorta, measuring 44 mm.   9. Evidence of atrial level shunting detected by color flow Doppler.   Conclusion(s)/Recommendation(s): No LA/LAA thrombus identified. Successful  cardioversion performed with restoration of normal sinus rhythm.    _____________   History of Present Illness     Billy Beck. is a 81 y.o. 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  Billy Beck reports an episode of chest pain at around 5 AM earlier this morning that woke him up from sleep.  Patient denies any palpitations, device  shocks, presyncope or syncope, difficulty breathing at times.  Lasted for about 30 to 40 minutes, self terminated..  Patient reports very similar episode in 2017 when he had episode of ventricular tachycardia.  Recently patient was admitted to to the [facility] with  treatment of pneumonia gram-positive pneumonia and superimposed setting of rhinovirus.  Following discharge in the hospital patient reports feeling well.  Above-mentioned episode earlier today.  Otherwise patient denies any exertional dyspnea, shortness of breath palpitations.  Patient's active and independent with his ADL. Following presentation to emergency room patient remained hemodynamically stable, no acute distress.  Patient is noted to be in atrial fibrillation with episodes of paced rhythm on telemetry.  Patient denies any recent fevers, chills, cough, nausea or diarrhea.  Hospital Course     Consultants: N/A   Patient was admitted to cardiology service for atrial fibrillation with RVR.  Unfortunately she has missed 1-2 doses of Eliquis over the past 2 weeks.  Heart rate was controlled in the emergency room.  Elevation of the troponin was thought to be demand ischemia in the setting of A-fib with RVR.  It was suspected that her chest discomfort was due to atrial fibrillation with RVR and not due to ischemia.  Previous cardiac catheterization in [place] prior to TAVR procedure showed a very minimal CAD in 2015.  Echocardiogram obtained on 02/20/2022 showed EF 50 to 55%, no regional wall motion abnormality, history of TAVR with mean gradient of 5.0 mmHg, mild dilatation of the aorta measuring at 44 mm.  He eventually underwent TEE cardioversion on 02/21/2022.  No left atrial appendage thrombus was noted.  Patient underwent successful cardioversion x1.  Normal sinus rhythm was achieved and confirmed with [facility] rep on interrogation of the device.  On the night of cardioversion, she did have some recurrent chest discomfort.  Patient was seen in the morning of 02/22/2022, there was no further chest pain.  Patient was examined again in the afternoon of 02/22/2022 by Dr. Harrington Challenger.  He was feeling good and was able to walk around without any issue.  The case was discussed with Dr. Quentin Ore of EP service  was followed him in the clinic.  EP service recommended follow-up for now and watch for recurrence of atrial fibrillation.  The right-sided chest pressure was felt to be atypical.  PPI was started.  Dr. Harrington Challenger recommended 4 weeks follow-up       Did the patient have an acute coronary syndrome (MI, NSTEMI, STEMI, etc) this admission?:  No.   The elevated Troponin was due to the acute medical illness (demand ischemia).         _____________  Discharge Vitals Blood pressure 125/72, pulse 70, temperature 97.7 F (36.5 C), temperature source Oral, resp. rate 20, height '6\' 1"'$  (1.854 m), weight 83.7 kg, SpO2 98 %.  Filed Weights   02/20/22 2154 02/21/22 0441 02/22/22 0500  Weight: 83 kg 83 kg 83.7 kg    Labs & Radiologic Studies    CBC Recent Labs    02/21/22 0700 02/22/22 0140  WBC 6.6 6.3  NEUTROABS 4.1  --   HGB 13.6 12.5*  HCT 41.8 37.6*  MCV 88.2 88.1  PLT 183 341   Basic Metabolic Panel Recent Labs    02/19/22 1834 02/20/22 0433 02/21/22 0700  NA  --  135 139  K  --  3.5 4.2  CL  --  102 104  CO2  --  26 27  GLUCOSE  --  113* 111*  BUN  --  10 11  CREATININE  --  1.00 1.06  CALCIUM  --  8.2* 9.0  MG 1.7  --   --    Liver Function Tests No results for input(s): "AST", "ALT", "ALKPHOS", "BILITOT", "PROT", "ALBUMIN" in the last 72 hours. No results for input(s): "LIPASE", "AMYLASE" in the last 72 hours. High Sensitivity Troponin:   Recent Labs  Lab 02/19/22 1452 02/19/22 1834 02/20/22 1212 02/20/22 1951  TROPONINIHS 86* 59* 19* 20*    BNP Invalid input(s): "POCBNP" D-Dimer No results for input(s): "DDIMER" in the last 72 hours. Hemoglobin A1C No results for input(s): "HGBA1C" in the last 72 hours. Fasting Lipid Panel Recent Labs    02/20/22 0433  CHOL 148  HDL 41  LDLCALC 86  TRIG 105  CHOLHDL 3.6   Thyroid Function Tests No results for input(s): "TSH", "T4TOTAL", "T3FREE", "THYROIDAB" in the last 72 hours.  Invalid input(s):  "FREET3" _____________  ECHO TEE  Result Date: 02/22/2022    TRANSESOPHOGEAL ECHO REPORT   Patient Name:   Billy Holtsclaw. Date of Exam: 02/21/2022 Medical Rec #:  [id number]             Height:       73.0 in Accession #:    [id number]            Weight:       183.0 lb Date of Birth:  1940-05-31            BSA:          2.072 m Patient Age:    34 years              BP:           131/91 mmHg Patient Gender: M                     HR:           51 bpm. Exam Location:  Inpatient Procedure: Transesophageal Echo, Color Doppler, Cardiac Doppler and 3D Echo Indications:     Atrial fibrillation  History:         Patient has prior history of Echocardiogram examinations, most                  recent 02/20/2022. Defibrillator; Risk Factors:Hypertension and                  Diabetes.                  Aortic Valve: TAVR valve is present in the aortic position.                  Procedure Date: 2012.  Sonographer:     Johny Chess RDCS Referring Phys:  [id number] Tami Lin [facility] Diagnosing Phys: Candee Furbish MD PROCEDURE: After discussion of the risks and benefits of a TEE, an informed consent was obtained from the patient. The transesophogeal probe was passed without difficulty through the esophogus of the patient. Imaged were obtained with the patient in a left lateral decubitus position. Sedation performed by different physician. The patient was monitored while under deep sedation. Anesthestetic sedation was provided intravenously by Anesthesiology: '336mg'$  of Propofol, '40mg'$  of Lidocaine. Image quality was excellent. The patient developed no complications during the procedure. A successful direct current cardioversion was performed at 200 joules with 1 attempt. IMPRESSIONS  1. Post afib ablation prior septal catheter orifice noted with left to right shunting.  2. Left  ventricular ejection fraction, by estimation, is 50 to 55%. The left ventricle has low normal function.  3. Right ventricular systolic function is  normal. The right ventricular size is normal.  4. Left atrial size was moderately dilated. No left atrial/left atrial appendage thrombus was detected.  5. Right atrial size was mildly dilated.  6. The mitral valve is normal in structure. Mild mitral valve regurgitation.  7. The aortic valve has been repaired/replaced. Aortic valve regurgitation is not visualized. There is a TAVR valve present in the aortic position. Procedure Date: 2012. Echo findings are consistent with normal structure and function of the aortic valve  prosthesis.  8. 44 mm noted on TTE. Aortic dilatation noted. There is mild dilatation of the ascending aorta, measuring 44 mm.  9. Evidence of atrial level shunting detected by color flow Doppler. Conclusion(s)/Recommendation(s): No LA/LAA thrombus identified. Successful cardioversion performed with restoration of normal sinus rhythm. FINDINGS  Left Ventricle: Left ventricular ejection fraction, by estimation, is 50 to 55%. The left ventricle has low normal function. The left ventricular internal cavity size was normal in size. Right Ventricle: The right ventricular size is normal. No increase in right ventricular wall thickness. Right ventricular systolic function is normal. Left Atrium: Left atrial size was moderately dilated. No left atrial/left atrial appendage thrombus was detected. Right Atrium: Right atrial size was mildly dilated. Pericardium: There is no evidence of pericardial effusion. Mitral Valve: The mitral valve is normal in structure. Mild mitral valve regurgitation. Tricuspid Valve: The tricuspid valve is normal in structure. Tricuspid valve regurgitation is mild. Aortic Valve: The aortic valve has been repaired/replaced. Aortic valve regurgitation is not visualized. There is a TAVR valve present in the aortic position. Procedure Date: 2012. Echo findings are consistent with normal structure and function of the aortic valve prosthesis. Pulmonic Valve: The pulmonic valve was normal  in structure. Pulmonic valve regurgitation is not visualized. Aorta: 44 mm noted on TTE. Aortic dilatation noted. There is mild dilatation of the ascending aorta, measuring 44 mm. IAS/Shunts: Evidence of atrial level shunting detected by color flow Doppler. Additional Comments: A device lead is visualized in the right ventricle. Candee Furbish MD Electronically signed by Candee Furbish MD Signature Date/Time: 02/22/2022/8:48:00 AM    Final    ECHOCARDIOGRAM COMPLETE  Result Date: 02/20/2022    ECHOCARDIOGRAM REPORT   Patient Name:   Billy Beck. Date of Exam: 02/20/2022 Medical Rec #:  [id number]             Height:       73.0 in Accession #:    [id number]            Weight:       188.0 lb Date of Birth:  05-30-1940            BSA:          2.096 m Patient Age:    39 years              BP:           132/81 mmHg Patient Gender: M                     HR:           80 bpm. Exam Location:  Inpatient Procedure: 2D Echo, Cardiac Doppler and Color Doppler Indications:    Chest pain  History:        Patient has prior history of Echocardiogram examinations, most  recent 01/03/2022. Defibrillator, Aortic Valve Disease,                 Arrythmias:Atrial Fibrillation; Risk Factors:Hypertension. See                 cardiology note for info about last echo. S/p ablation. S/p TAVR                 valve.  Sonographer:    Clayton Lefort RDCS (AE) Referring Phys: [facility] NICOLE [facility]  Sonographer Comments: Suboptimal subcostal window. IMPRESSIONS  1. Left ventricular ejection fraction, by estimation, is 50 to 55%. The left ventricle has low normal function. The left ventricle has no regional wall motion abnormalities. There is moderate concentric left ventricular hypertrophy. Left ventricular diastolic parameters are indeterminate.  2. Right ventricular systolic function is normal. The right ventricular size is normal. Tricuspid regurgitation signal is inadequate for assessing PA pressure.  3. Left atrial size was  moderately dilated.  4. The mitral valve is grossly normal. No evidence of mitral valve regurgitation. No evidence of mitral stenosis.  5. The aortic valve has been replaced with a TAVR valve with slightly low deployment. No MR or SAM. Aortic valve regurgitation is not visualized. Effective orifice area, by VTI measures 2.43 cm. Aortic valve mean gradient measures 5.0 mmHg.  6. Aortic dilatation noted. There is mild dilatation of the ascending aorta, measuring 44 mm. Comparison(s): No prior Echocardiogram. FINDINGS  Left Ventricle: Left ventricular ejection fraction, by estimation, is 50 to 55%. The left ventricle has low normal function. The left ventricle has no regional wall motion abnormalities. The left ventricular internal cavity size was normal in size. There is moderate concentric left ventricular hypertrophy. Left ventricular diastolic parameters are indeterminate. Right Ventricle: The right ventricular size is normal. No increase in right ventricular wall thickness. Right ventricular systolic function is normal. Tricuspid regurgitation signal is inadequate for assessing PA pressure. Left Atrium: Left atrial size was moderately dilated. Right Atrium: Right atrial size was normal in size. Pericardium: There is no evidence of pericardial effusion. Mitral Valve: The mitral valve is grossly normal. No evidence of mitral valve regurgitation. No evidence of mitral valve stenosis. Tricuspid Valve: The tricuspid valve is normal in structure. Tricuspid valve regurgitation is not demonstrated. No evidence of tricuspid stenosis. Aortic Valve: The aortic valve has been repaired/replaced. Aortic valve regurgitation is not visualized. Aortic valve mean gradient measures 5.0 mmHg. Aortic valve peak gradient measures 10.0 mmHg. Aortic valve area, by VTI measures 2.43 cm. Pulmonic Valve: The pulmonic valve was not well visualized. Pulmonic valve regurgitation is not visualized. No evidence of pulmonic stenosis. Aorta:  Aortic dilatation noted. There is mild dilatation of the ascending aorta, measuring 44 mm. IAS/Shunts: No atrial level shunt detected by color flow Doppler. Additional Comments: A device lead is visualized in the right ventricle and right atrium.  LEFT VENTRICLE PLAX 2D LVIDd:         5.00 cm LVIDs:         3.60 cm LV PW:         1.30 cm LV IVS:        1.40 cm LVOT diam:     2.50 cm LV SV:         66 LV SV Index:   31 LVOT Area:     4.91 cm  RIGHT VENTRICLE RV Basal diam:  3.40 cm RV S prime:     5.55 cm/s TAPSE (M-mode): 1.0 cm LEFT ATRIUM  Index        RIGHT ATRIUM           Index LA diam:      5.90 cm 2.82 cm/m   RA Area:     23.10 cm LA Vol (A2C): 59.9 ml 28.58 ml/m  RA Volume:   59.80 ml  28.53 ml/m LA Vol (A4C): 85.2 ml 40.65 ml/m  AORTIC VALVE AV Area (Vmax):    2.08 cm AV Area (Vmean):   2.04 cm AV Area (VTI):     2.43 cm AV Vmax:           158.00 cm/s AV Vmean:          104.500 cm/s AV VTI:            0.270 m AV Peak Grad:      10.0 mmHg AV Mean Grad:      5.0 mmHg LVOT Vmax:         66.90 cm/s LVOT Vmean:        43.400 cm/s LVOT VTI:          0.134 m LVOT/AV VTI ratio: 0.50  AORTA Ao Root diam: 3.10 cm Ao Asc diam:  4.40 cm  SHUNTS Systemic VTI:  0.13 m Systemic Diam: 2.50 cm Rudean Haskell MD Electronically signed by Rudean Haskell MD Signature Date/Time: 02/20/2022/4:25:33 PM    Final    DG Chest 2 View  Result Date: 02/19/2022 CLINICAL DATA:  Chest pain. EXAM: CHEST - 2 VIEW COMPARISON:  Chest radiograph November 03, 2021 FINDINGS: Dual lead pacer apparatus overlies the left hemithorax, leads are stable in position. Left atrial appendage clip unchanged. Stent graft unchanged. Stable cardiac and mediastinal contours. Elevated right hemidiaphragm. Bibasilar heterogeneous opacities. No pleural effusion or pneumothorax. Thoracic spine degenerative changes. IMPRESSION: No active cardiopulmonary disease. Electronically Signed   By: Lovey Newcomer M.D.   On: 02/19/2022 15:38    Disposition   Pt is being discharged home today in good condition.  Follow-up Plans & Appointments     Follow-up Information     Elgie Collard, PA-C Follow up on 03/22/2022.   Specialty: Cardiology Why: 10:30AM. Cardiology follow up Contact information: [place] [address] [phone]                Discharge Instructions     Amb referral to AFIB Clinic   Complete by: As directed         Discharge Medications   Allergies as of 02/22/2022       Reactions   Codeine Other (See Comments)   Other reaction(s): Other - See Comments Chest pain  Chest pain  Chest pressure.  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   Tape    Other reaction(s): Other (comments) Blisters  Blisters, redness, itching PAPER TAPE PREFERRED   Gabapentin Nausea Only, Other (See Comments)   Other reaction(s): Nausea and/or vomiting-Intolerance dizziness dizziness   Morphine    Other reaction(s): Other - See Comments, Unknown Morphine doesn't work  Morphine doesn't work  "Does not work for me"   Simvastatin    Other reaction(s): Unknown        Medication List     STOP taking these medications    clopidogrel 75 MG tablet  Commonly known as: PLAVIX       TAKE these medications    acetaminophen 650 MG CR tablet Commonly known as: TYLENOL Take 650 mg by mouth every 8 (eight) hours as needed for pain.   albuterol 108 (90 Base) MCG/ACT inhaler Commonly known as: VENTOLIN HFA Inhale 1 puff into the lungs every 6 (six) hours as needed for wheezing.   cholestyramine 4 g packet Commonly known as: QUESTRAN Take 1 packet (4 g total) by mouth daily. Take 2 hours before or after all other medications   Eliquis 5 MG Tabs  tablet Generic drug: apixaban Take 5 mg by mouth 2 (two) times daily.   furosemide 20 MG tablet Commonly known as: LASIX Take 20 mg by mouth daily.   hydroxyprophyl methylcellulose / hypromellose 0.5 % opthalmic solution Commonly known as: ISOPTO TEARS Place 1 drop into both eyes daily as needed for dry eyes.   isosorbide mononitrate 60 MG 24 hr tablet Commonly known as: IMDUR Take 1 tablet (60 mg total) by mouth 2 (two) times daily.   magnesium oxide 400 MG tablet Commonly known as: MAG-OX Take 400 mg by mouth daily.   metoprolol succinate 50 MG 24 hr tablet Commonly known as: TOPROL-XL Take 50 mg by mouth daily.   sertraline 100 MG tablet Commonly known as: ZOLOFT Take 100 mg by mouth 2 (two) times daily.   temazepam 15 MG capsule Commonly known as: RESTORIL Take 15 mg by mouth at bedtime as needed.   Vimpat 150 MG Tabs Generic drug: Lacosamide Take 150 mg by mouth 2 (two) times daily.           Outstanding Labs/Studies   N/A  Duration of Discharge Encounter   Greater than 30 minutes including physician time.  Hilbert Corrigan, PA 02/22/2022, 4:37 PM

## 2022-02-24 ENCOUNTER — Encounter (HOSPITAL_COMMUNITY): Payer: Self-pay | Admitting: Cardiology

## 2022-02-25 ENCOUNTER — Encounter: Payer: Self-pay | Admitting: Cardiology

## 2022-02-25 ENCOUNTER — Encounter: Payer: Self-pay | Admitting: Internal Medicine

## 2022-02-26 NOTE — Telephone Encounter (Signed)
See other encounter.

## 2022-02-27 ENCOUNTER — Telehealth: Payer: Self-pay | Admitting: Cardiology

## 2022-02-27 NOTE — Telephone Encounter (Signed)
Calling because the need the dr to sign the patient death certificate. Please advise

## 2022-03-01 ENCOUNTER — Ambulatory Visit: Payer: Medicare Other | Admitting: Gastroenterology

## 2022-03-14 DEATH — deceased

## 2022-03-22 ENCOUNTER — Ambulatory Visit: Payer: Medicare Other | Admitting: Physician Assistant

## 2022-04-13 ENCOUNTER — Institutional Professional Consult (permissible substitution): Payer: Medicare Other | Admitting: Cardiovascular Disease

## 2023-03-28 IMAGING — DX DG CHEST 2V
2 series · 2 of 2 positions shown · non-contrast
Comparison: Chest radiograph May 11, 2021

CLINICAL DATA: Lead position.

EXAM:
CHEST - 2 VIEW

[dg chest 2 view (1 of 2)]
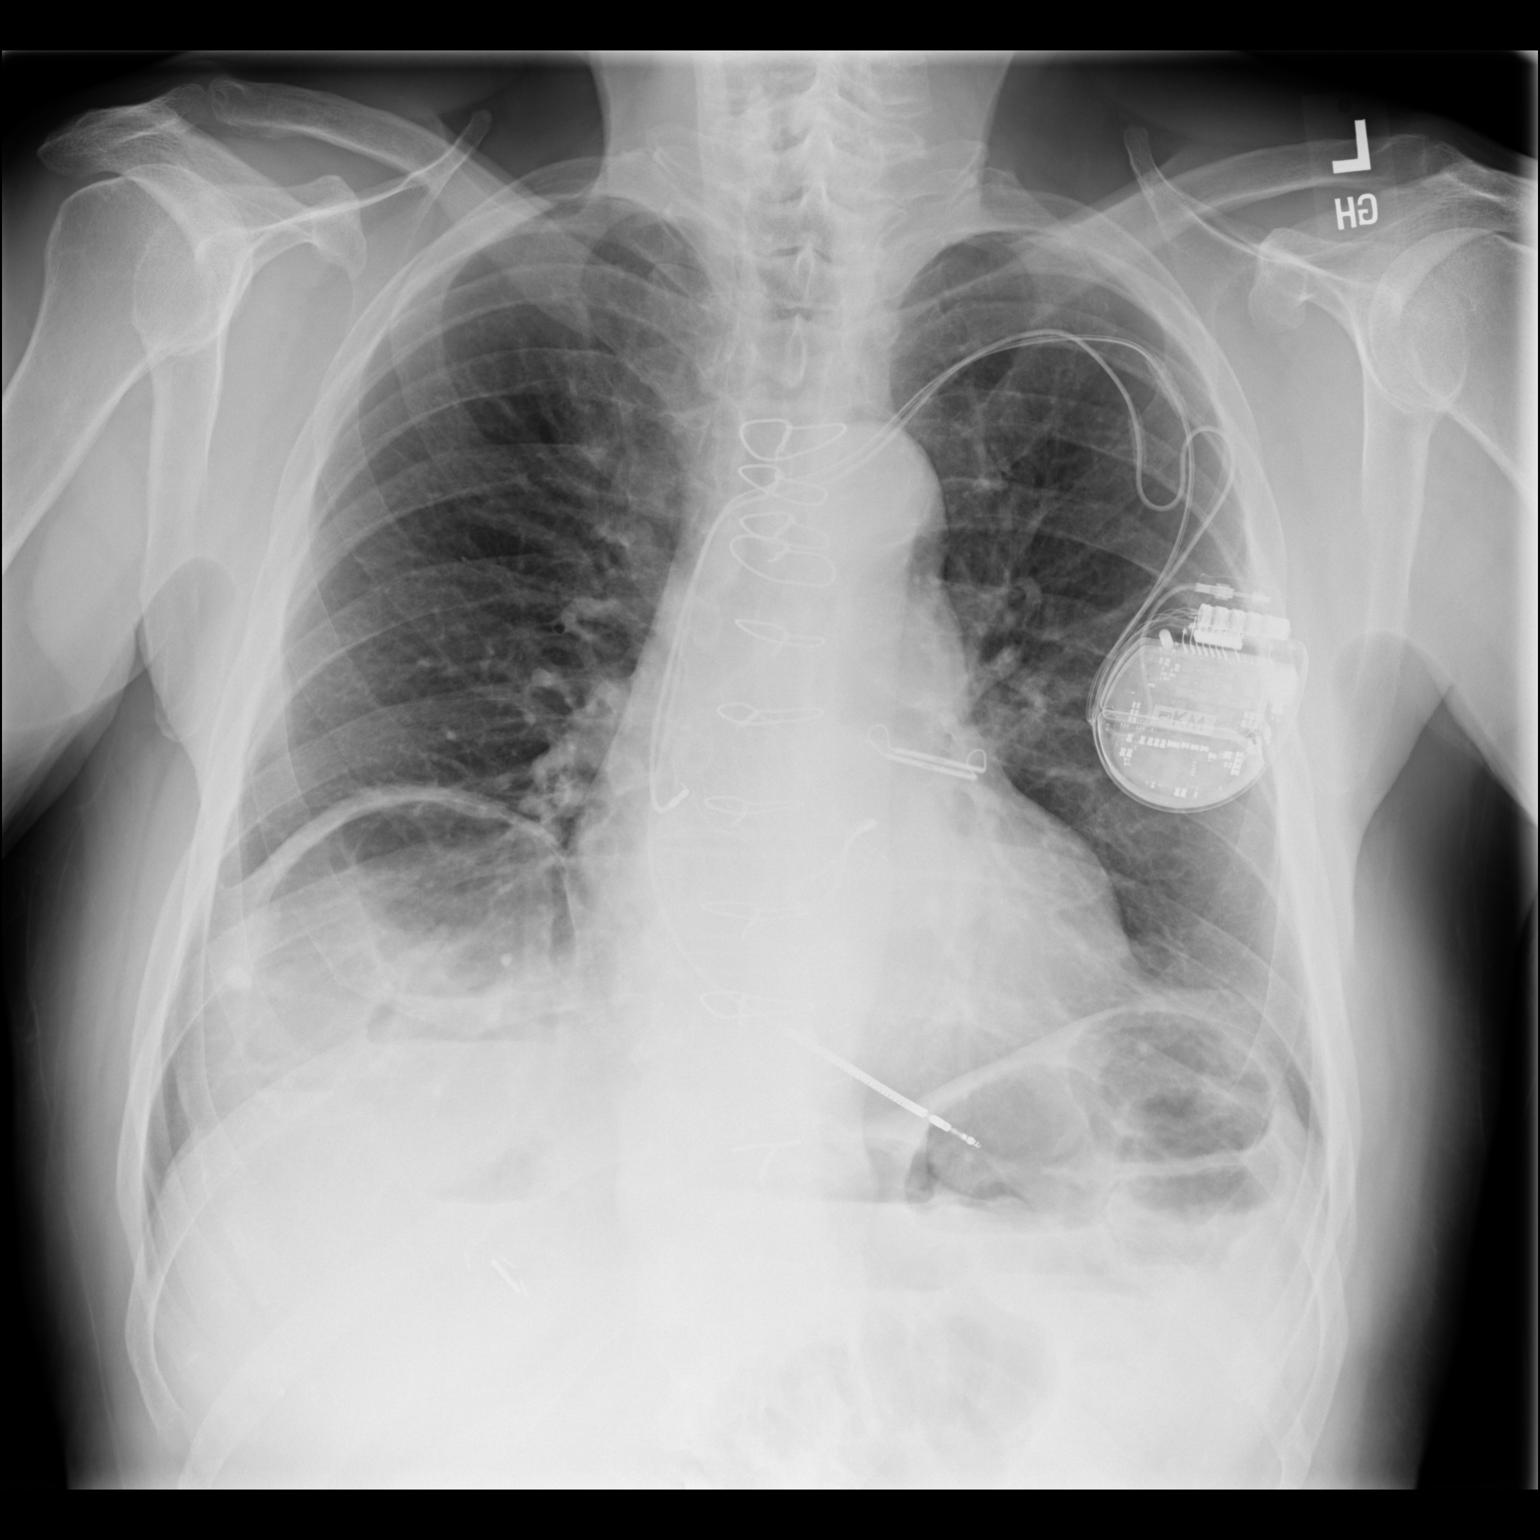

[dg chest 2 view (2 of 2)]
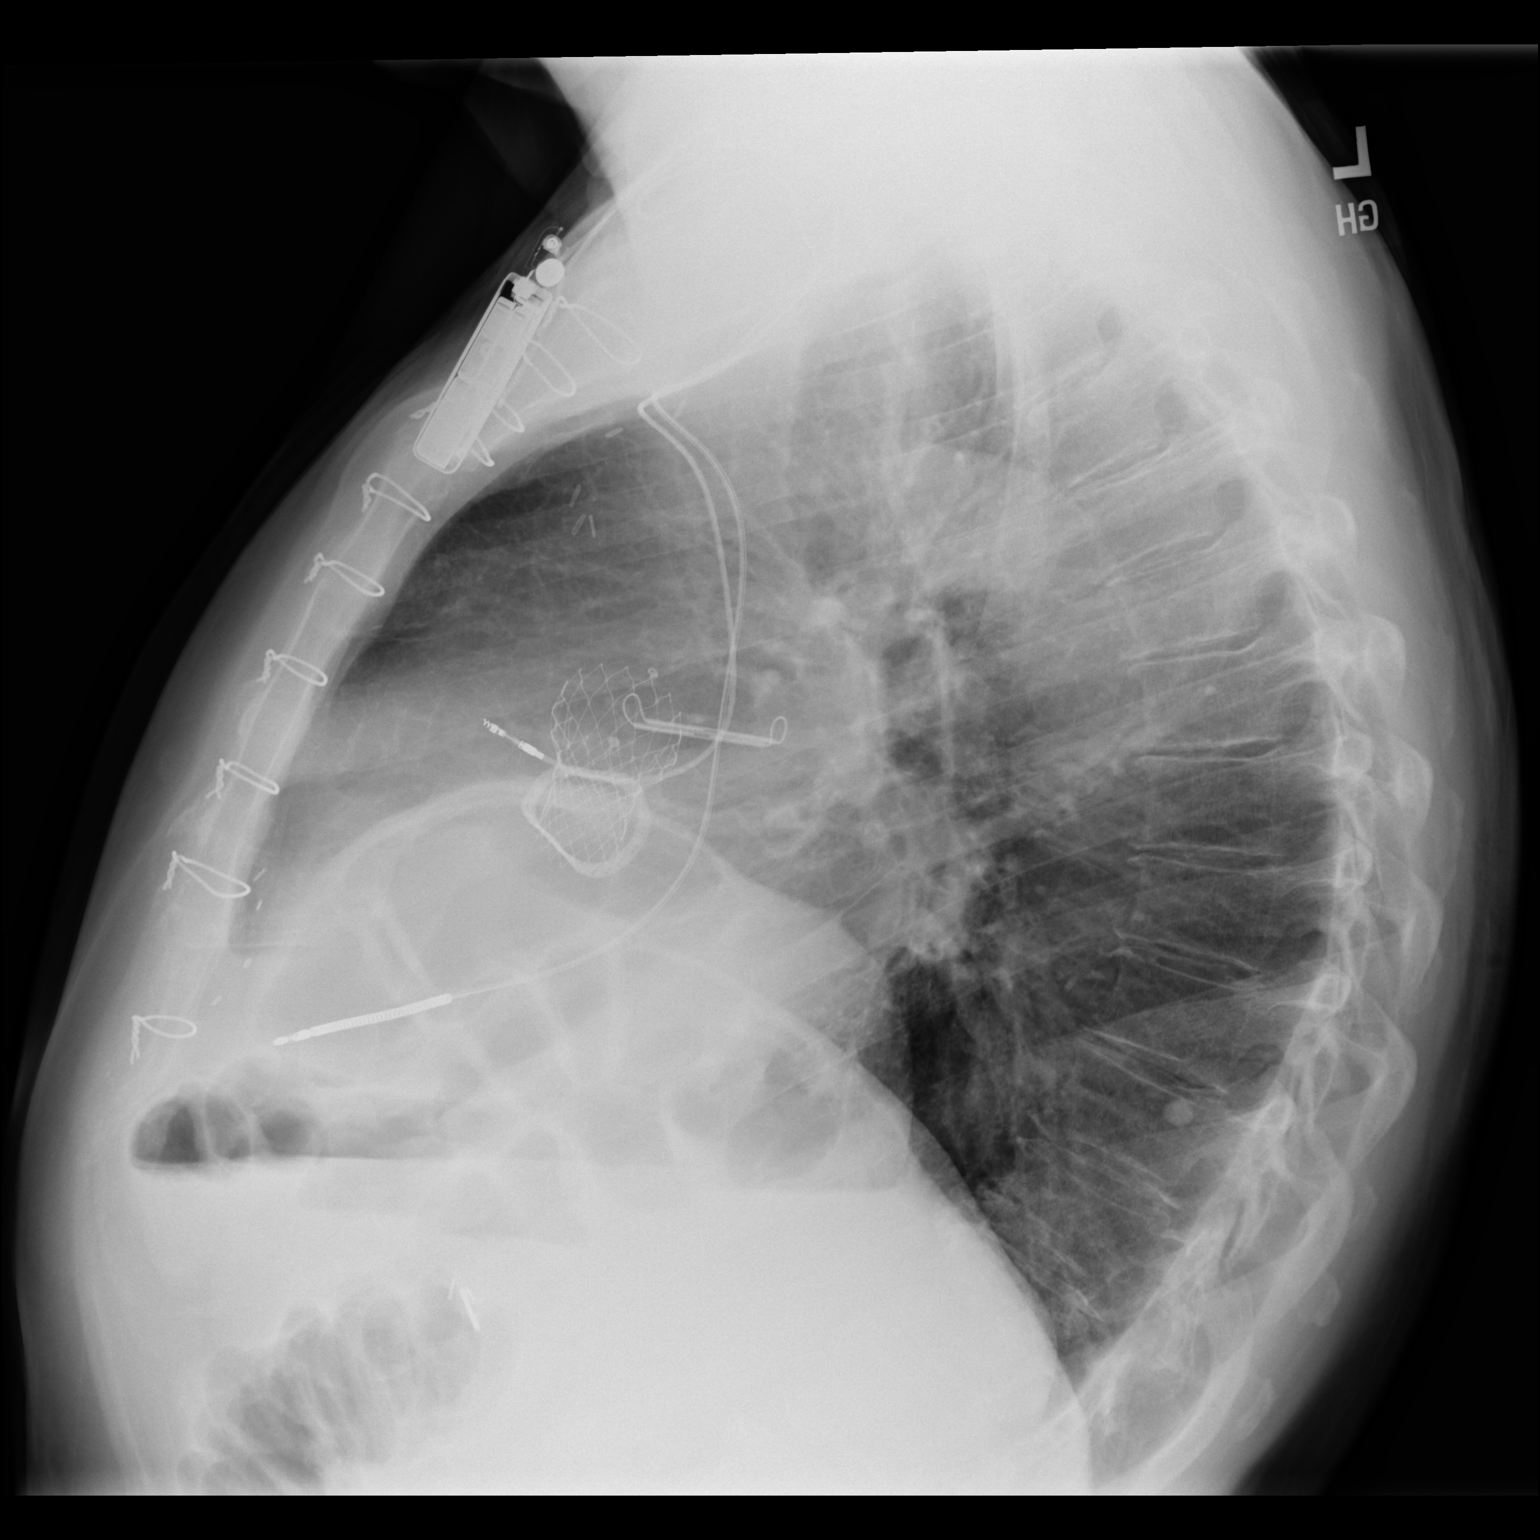

[2 of 2 positions shown; findings below may reference images not displayed]

FINDINGS: Multi lead AICD device overlies the left hemithorax, leads are
stable in position. Additional support apparatus stable in position.
Elevation right hemidiaphragm. No large area pulmonary
consolidation. No pleural effusion or pneumothorax. Thoracic spine
degenerative changes. Possible calcified nodule on the lateral view
projecting over the thoracic spine.
IMPRESSION: No active cardiopulmonary disease.
